# Patient Record
Sex: Male | Born: 1990 | Race: Black or African American | Hispanic: No | Marital: Married | Smoking: Never smoker
Health system: Southern US, Community
[De-identification: ages and names within clinical notes are randomized; demographics above are authoritative.]

## PROBLEM LIST (undated history)

## (undated) DIAGNOSIS — J309 Allergic rhinitis, unspecified: Secondary | ICD-10-CM

## (undated) DIAGNOSIS — F419 Anxiety disorder, unspecified: Secondary | ICD-10-CM

## (undated) DIAGNOSIS — G4700 Insomnia, unspecified: Secondary | ICD-10-CM

## (undated) DIAGNOSIS — F909 Attention-deficit hyperactivity disorder, unspecified type: Secondary | ICD-10-CM

## (undated) HISTORY — DX: Allergic rhinitis, unspecified: J30.9

## (undated) HISTORY — DX: Insomnia, unspecified: G47.00

## (undated) HISTORY — DX: Attention-deficit hyperactivity disorder, unspecified type: F90.9

## (undated) HISTORY — PX: ARTERIAL BYPASS SURGRY: SHX557

## (undated) HISTORY — DX: Anxiety disorder, unspecified: F41.9

---
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
## 2014-03-28 ENCOUNTER — Emergency Department
Admission: EM | Admit: 2014-03-28 | Discharge: 2014-03-28 | Disposition: A | Discharge status: Home / Self Care | Payer: Auto Insurance (includes no fault) | Attending: Emergency Medicine | Admitting: Emergency Medicine

## 2014-03-28 ENCOUNTER — Emergency Department: Payer: Auto Insurance (includes no fault)

## 2014-03-28 ENCOUNTER — Emergency Department: Payer: Self-pay

## 2014-03-28 DIAGNOSIS — S40012A Contusion of left shoulder, initial encounter: Secondary | ICD-10-CM

## 2014-03-28 DIAGNOSIS — S161XXA Strain of muscle, fascia and tendon at neck level, initial encounter: Secondary | ICD-10-CM

## 2014-03-28 DIAGNOSIS — S40019A Contusion of unspecified shoulder, initial encounter: Secondary | ICD-10-CM | POA: Insufficient documentation

## 2014-03-28 DIAGNOSIS — S139XXA Sprain of joints and ligaments of unspecified parts of neck, initial encounter: Secondary | ICD-10-CM | POA: Insufficient documentation

## 2014-03-28 MED ORDER — IBUPROFEN 600 MG PO TABS
600.0000 mg | ORAL_TABLET | Freq: Once | ORAL | Status: AC
Start: 2014-03-28 — End: 2014-03-28
  Administered 2014-03-28: 600 mg via ORAL

## 2014-03-28 MED ORDER — IBUPROFEN 600 MG PO TABS
ORAL_TABLET | ORAL | Status: AC
Start: 2014-03-28 — End: ?
  Filled 2014-03-28: qty 1

## 2014-03-28 NOTE — ED Notes (Signed)
Pt placed in c-collar per verbal order Dr. Adron Bene.
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
## 2014-03-28 NOTE — Discharge Instructions (Signed)
Contusions (Bruises)  A contusion is a bruise. A bruise happenswhen a blow to your body doesn't break the skin but does break blood vessels beneath the skin. Blood leaking from the broken vessels causes redness and swelling. As it heals, your bruise is likely to turn colors like purple, green, and yellow. This is normal. The bruise should fade in 2 or 3 weeks.    Factors That Make You More Likely to Bruise  Almost everyone bruises now and then. Certain people do bruise more easily than others. You're more prone to bruising as you get older. That's because blood vessels become more fragile with age. You're also more likely to bruise if you have a clotting disorder such as hemophilia or take medications that reduce clotting, including aspirin.  When to Go to the Emergency Room (ER)  Bruises almost always heal on their own without special treatment. But for some people, a bad bruise can be serious. Seek medical care if you:   Have a clotting disorder such as hemophilia.   Have cirrhosis or other serious liver disease.   Takeblood-thinning medications such as warfarin (Coumadin).  Tip:  Apply an ice pack or bag of frozen peas to a bruise (keep a thin cloth between the cold source and your skin). This can help reduce redness and swelling.   What to Expect in the ER  A doctor will examine your bruise and ask about any health conditions you have. In some cases, you may have a test to check how well your blood clots. Other treatment will depend on your needs.  Follow-up  Sometimes a bruise gets worse instead of better. It may become larger and more swollen. This can occur when your body walls off a small pool of blood under the skin (hematoma). In that case, your doctor may need to drain excess blood from the area.   2000-2014 [employer] [address], [place] [address]. All rights reserved. This information is not intended as a substitute for professional medical care. Always follow your  healthcare professional's instructions.          Neck Sprain Or Strain  A sudden force that causes turning or bending of the neck (such as in a car accident) can stretch or tear muscles (strain) and ligaments (sprain) and cause neck pain. Sometimes neck pain occurs after a simple awkward movement. In either case, muscle spasm is commonly present and contributes to the pain.    Unless you had a forceful physical injury (for example, a car accident or fall), X-rays are usually not ordered for the initial evaluation of neck pain. If pain continues and dose not respond to medical treatment, X-rays and other tests may be performed at a later time.  Home care  The following guidelines will help you care for your injury at home:   You may feel more soreness and spasm the first few days after the injury. Reduce your activity level until symptoms begin to improve.   When lying down, use a comfortable pillow that supports the head and keeps the spine in a neutral position. The position of the head should not be tilted forward or backward.   Use ice packs (ice in a plastic bag, wrapped in a towel) to treat acute pain. Apply for 20 minutes every 2-4 hours during the first two days. Then, begin local heat (hot shower, hot bath or heating pad) andmassageto reduce muscle spasm. Some patients feel best alternating hot and cold treatments,   or just staying with one method only. Do what feels the best to you and gives the most relief.   You may use acetaminophen or ibuprofen to control pain, unless another pain medicine was prescribed.If you have chronic liver or kidney disease or ever had a stomach ulcer or GI bleeding, talk with your doctor before using these medicines.  Follow-up care  Follow up with your physician or this facility if your symptoms do not show signs of improvement. Physical therapy may be needed.  If you had X-rays today, they didn't show any broken bones, breaks, or fractures. Sometimes fractures don't show  up on the first X-ray. Bruises and sprains can sometimes hurt as much as a fracture. These injuries can take time to heal completely. If your symptoms don't improve or they get worse, talk with your doctor. You may need a repeat X-ray.  When to seek medical care  Get prompt medical attention if any of the following occur:   Pain becomes worse or spreads into your arms   Weakness or numbness in one or both arms   2000-2014 [employer]. [address], [place] [address]. All rights reserved. This information is not intended as a substitute for professional medical care. Always follow your healthcare professional's instructions.
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
## 2016-08-25 ENCOUNTER — Ambulatory Visit (INDEPENDENT_AMBULATORY_CARE_PROVIDER_SITE_OTHER): Payer: 59 | Admitting: Physician Assistant

## 2016-08-25 ENCOUNTER — Ambulatory Visit (INDEPENDENT_AMBULATORY_CARE_PROVIDER_SITE_OTHER): Payer: 59

## 2016-08-25 VITALS — BP 120/64 | HR 74 | Temp 98.2°F | Ht 67.5 in | Wt 162.0 lb

## 2016-08-25 DIAGNOSIS — M542 Cervicalgia: Secondary | ICD-10-CM

## 2016-08-25 MED ORDER — CYCLOBENZAPRINE HCL 10 MG PO TABS: 5.0000 mg | ORAL_TABLET | Freq: Three times a day (TID) | ORAL | 0 refills | Status: DC | PRN

## 2016-08-25 MED ORDER — KETOROLAC TROMETHAMINE 60 MG/2ML IM SOLN
60.0000 mg | Freq: Once | INTRAMUSCULAR | Status: AC
  Administered 2016-08-25: 60 mg via INTRAMUSCULAR

## 2016-08-25 MED ORDER — NAPROXEN 500 MG PO TABS: 500.0000 mg | ORAL_TABLET | Freq: Two times a day (BID) | ORAL | 0 refills | Status: DC

## 2016-08-25 NOTE — Progress Notes (Signed)
08/25/2016 4:28 PM   DOB: 1991/01/01 / MRN: [id number]  SUBJECTIVE:  Chris Vega is a 26 y.o. male presenting for neck pain since October that has recently worsened today.  Complains of left sided "sharp, shooting" neck pain.  He does associates some left shoulder pain.  He denies paresthesia and weakness today.    He has No Known Allergies.   He  has no past medical history on file.    He  reports that he has never smoked. He does not have any smokeless tobacco history on file. He  has no sexual activity history on file. The patient  has no past surgical history on file.  His family history is not on file.  Review of Systems  Constitutional: Negative for fever.  Musculoskeletal: Positive for neck pain. Negative for back pain, falls, joint pain and myalgias.  Skin: Negative for itching and rash.  Neurological: Negative for dizziness, sensory change and focal weakness.    The problem list and medications were reviewed and updated by myself where necessary and exist elsewhere in the encounter.   OBJECTIVE:  BP 120/64 (BP Location: Right Arm, Patient Position: Sitting, Cuff Size: Normal)   Pulse 74   Temp 98.2 F (36.8 C)   Ht 5' 7.5" (1.715 m)   Wt 162 lb (73.5 kg)   SpO2 98%   BMI 25.00 kg/m   Physical Exam  Constitutional: He is oriented to person, place, and time. He appears well-developed and well-nourished. No distress.  Cardiovascular: Normal rate and regular rhythm.   Pulmonary/Chest: Effort normal and breath sounds normal.  Musculoskeletal: He exhibits no edema or deformity.       Cervical back: He exhibits decreased range of motion (2/2 pain), tenderness and pain. He exhibits no bony tenderness, no swelling, no edema and no deformity.       Back:  Neurological: He is alert and oriented to person, place, and time. He has normal reflexes. He displays normal reflexes. No cranial nerve deficit. He exhibits normal muscle tone. Coordination normal.  Skin: Skin is warm and  dry. He is not diaphoretic.  Psychiatric: He has a normal mood and affect.    No results found for this or any previous visit (from the past 72 hour(s)).  Dg Cervical Spine Complete  Result Date: 08/25/2016 CLINICAL DATA:  26 year old male with acute on chronic cervical neck pain. Neck injury 5 months ago. Initial encounter. EXAM: CERVICAL SPINE - COMPLETE 4+ VIEW COMPARISON:  None. FINDINGS: Mild straightening of cervical lordosis. Normal prevertebral soft tissue contour. Cervicothoracic junction alignment is within normal limits. Relatively preserved cervical disc spaces. Bilateral posterior element alignment is within normal limits. Normal AP alignment. Negative lung apices and visible upper chest. Normal C1-C2 alignment and odontoid. IMPRESSION: Normal radiographic appearance of the cervical spine aside from mild nonspecific straightening of cervical lordosis. Electronically Signed   By: Odessa FlemingH  Hall M.D.   On: 08/25/2016 16:21    ASSESSMENT AND PLAN  Chris Vega was seen today for neck pain.  Diagnoses and all orders for this visit:  Neck pain: This seems acute.  Rads normal.  No radiculopathy thus nsaids and flexeril for now.  Advised he call if radiculopthy and will start pred. -     DG Cervical Spine Complete; Future -     ketorolac (TORADOL) injection 60 mg; Inject 2 mLs (60 mg total) into the muscle once.    The patient is advised to call or return to clinic if he does not see an  improvement in symptoms, or to seek the care of the closest emergency department if he worsens with the above plan.   Deliah Boston, MHS, PA-C [facility] 08/25/2016 4:28 PM

## 2016-08-25 NOTE — Patient Instructions (Signed)
     IF you received an x-ray today, you will receive an invoice from [facility]. Please contact [facility] at [phone] with questions or concerns regarding your invoice.   IF you received labwork today, you will receive an invoice from [facility]. Please contact [facility] at [phone] with questions or concerns regarding your invoice.   Our billing staff will not be able to assist you with questions regarding bills from these companies.  You will be contacted with the lab results as soon as they are available. The fastest way to get your results is to activate your My Chart account. Instructions are located on the last page of this paperwork. If you have not heard from us regarding the results in 2 weeks, please contact this office.     

## 2018-10-25 ENCOUNTER — Ambulatory Visit: Payer: 59 | Admitting: Psychology

## 2018-10-25 DIAGNOSIS — F4322 Adjustment disorder with anxiety: Secondary | ICD-10-CM | POA: Diagnosis not present

## 2018-10-27 ENCOUNTER — Ambulatory Visit: Payer: 59 | Admitting: Psychology

## 2018-11-03 ENCOUNTER — Other Ambulatory Visit: Payer: Self-pay

## 2018-11-03 ENCOUNTER — Ambulatory Visit: Payer: 59 | Admitting: Psychology

## 2018-11-03 DIAGNOSIS — F4322 Adjustment disorder with anxiety: Secondary | ICD-10-CM

## 2018-11-07 ENCOUNTER — Other Ambulatory Visit: Payer: Self-pay

## 2018-11-07 ENCOUNTER — Ambulatory Visit: Payer: 59 | Admitting: Psychology

## 2018-11-07 DIAGNOSIS — F4322 Adjustment disorder with anxiety: Secondary | ICD-10-CM

## 2018-11-16 ENCOUNTER — Ambulatory Visit: Payer: 59 | Admitting: Psychology

## 2018-11-21 ENCOUNTER — Ambulatory Visit (INDEPENDENT_AMBULATORY_CARE_PROVIDER_SITE_OTHER): Payer: 59 | Admitting: Psychology

## 2018-11-21 DIAGNOSIS — F4322 Adjustment disorder with anxiety: Secondary | ICD-10-CM | POA: Diagnosis not present

## 2018-11-29 ENCOUNTER — Ambulatory Visit (INDEPENDENT_AMBULATORY_CARE_PROVIDER_SITE_OTHER): Payer: 59 | Admitting: Psychology

## 2018-11-29 DIAGNOSIS — F4322 Adjustment disorder with anxiety: Secondary | ICD-10-CM

## 2018-12-07 ENCOUNTER — Ambulatory Visit (INDEPENDENT_AMBULATORY_CARE_PROVIDER_SITE_OTHER): Payer: 59 | Admitting: Psychology

## 2018-12-07 DIAGNOSIS — F4322 Adjustment disorder with anxiety: Secondary | ICD-10-CM | POA: Diagnosis not present

## 2018-12-14 ENCOUNTER — Ambulatory Visit (INDEPENDENT_AMBULATORY_CARE_PROVIDER_SITE_OTHER): Payer: 59 | Admitting: Psychology

## 2018-12-14 DIAGNOSIS — F4322 Adjustment disorder with anxiety: Secondary | ICD-10-CM | POA: Diagnosis not present

## 2018-12-21 ENCOUNTER — Ambulatory Visit (INDEPENDENT_AMBULATORY_CARE_PROVIDER_SITE_OTHER): Payer: 59 | Admitting: Psychology

## 2018-12-21 DIAGNOSIS — F4322 Adjustment disorder with anxiety: Secondary | ICD-10-CM | POA: Diagnosis not present

## 2018-12-28 ENCOUNTER — Ambulatory Visit (INDEPENDENT_AMBULATORY_CARE_PROVIDER_SITE_OTHER): Payer: 59 | Admitting: Psychology

## 2018-12-28 DIAGNOSIS — F4322 Adjustment disorder with anxiety: Secondary | ICD-10-CM

## 2019-01-03 ENCOUNTER — Ambulatory Visit (INDEPENDENT_AMBULATORY_CARE_PROVIDER_SITE_OTHER): Payer: 59 | Admitting: Psychology

## 2019-01-03 DIAGNOSIS — F4322 Adjustment disorder with anxiety: Secondary | ICD-10-CM | POA: Diagnosis not present

## 2019-01-10 ENCOUNTER — Ambulatory Visit (INDEPENDENT_AMBULATORY_CARE_PROVIDER_SITE_OTHER): Payer: 59 | Admitting: Psychology

## 2019-01-10 DIAGNOSIS — F4322 Adjustment disorder with anxiety: Secondary | ICD-10-CM

## 2019-01-17 ENCOUNTER — Ambulatory Visit (INDEPENDENT_AMBULATORY_CARE_PROVIDER_SITE_OTHER): Payer: 59 | Admitting: Psychology

## 2019-01-17 DIAGNOSIS — F4322 Adjustment disorder with anxiety: Secondary | ICD-10-CM | POA: Diagnosis not present

## 2019-01-24 ENCOUNTER — Ambulatory Visit (INDEPENDENT_AMBULATORY_CARE_PROVIDER_SITE_OTHER): Payer: 59 | Admitting: Psychology

## 2019-01-24 DIAGNOSIS — F4322 Adjustment disorder with anxiety: Secondary | ICD-10-CM

## 2019-01-31 ENCOUNTER — Ambulatory Visit (INDEPENDENT_AMBULATORY_CARE_PROVIDER_SITE_OTHER): Payer: 59 | Admitting: Psychology

## 2019-01-31 DIAGNOSIS — F4322 Adjustment disorder with anxiety: Secondary | ICD-10-CM

## 2019-02-07 ENCOUNTER — Ambulatory Visit (INDEPENDENT_AMBULATORY_CARE_PROVIDER_SITE_OTHER): Payer: 59 | Admitting: Psychology

## 2019-02-07 DIAGNOSIS — F4322 Adjustment disorder with anxiety: Secondary | ICD-10-CM | POA: Diagnosis not present

## 2019-02-14 ENCOUNTER — Ambulatory Visit (INDEPENDENT_AMBULATORY_CARE_PROVIDER_SITE_OTHER): Payer: 59 | Admitting: Psychology

## 2019-02-14 DIAGNOSIS — F4322 Adjustment disorder with anxiety: Secondary | ICD-10-CM | POA: Diagnosis not present

## 2019-02-21 ENCOUNTER — Ambulatory Visit (INDEPENDENT_AMBULATORY_CARE_PROVIDER_SITE_OTHER): Payer: 59 | Admitting: Psychology

## 2019-02-21 DIAGNOSIS — F4322 Adjustment disorder with anxiety: Secondary | ICD-10-CM | POA: Diagnosis not present

## 2019-02-28 ENCOUNTER — Ambulatory Visit (INDEPENDENT_AMBULATORY_CARE_PROVIDER_SITE_OTHER): Payer: 59 | Admitting: Psychology

## 2019-02-28 DIAGNOSIS — F4322 Adjustment disorder with anxiety: Secondary | ICD-10-CM | POA: Diagnosis not present

## 2019-03-07 ENCOUNTER — Ambulatory Visit (INDEPENDENT_AMBULATORY_CARE_PROVIDER_SITE_OTHER): Payer: 59 | Admitting: Psychology

## 2019-03-07 DIAGNOSIS — F4322 Adjustment disorder with anxiety: Secondary | ICD-10-CM | POA: Diagnosis not present

## 2019-03-14 ENCOUNTER — Ambulatory Visit (INDEPENDENT_AMBULATORY_CARE_PROVIDER_SITE_OTHER): Payer: 59 | Admitting: Psychology

## 2019-03-14 DIAGNOSIS — F4322 Adjustment disorder with anxiety: Secondary | ICD-10-CM

## 2019-03-28 ENCOUNTER — Ambulatory Visit (INDEPENDENT_AMBULATORY_CARE_PROVIDER_SITE_OTHER): Payer: 59 | Admitting: Psychology

## 2019-03-28 DIAGNOSIS — F4322 Adjustment disorder with anxiety: Secondary | ICD-10-CM | POA: Diagnosis not present

## 2019-04-06 ENCOUNTER — Ambulatory Visit (INDEPENDENT_AMBULATORY_CARE_PROVIDER_SITE_OTHER): Payer: 59 | Admitting: Psychology

## 2019-04-06 DIAGNOSIS — F4322 Adjustment disorder with anxiety: Secondary | ICD-10-CM | POA: Diagnosis not present

## 2019-04-11 ENCOUNTER — Ambulatory Visit: Payer: 59 | Admitting: Psychology

## 2019-04-18 ENCOUNTER — Ambulatory Visit (INDEPENDENT_AMBULATORY_CARE_PROVIDER_SITE_OTHER): Payer: 59 | Admitting: Psychology

## 2019-04-18 DIAGNOSIS — F4322 Adjustment disorder with anxiety: Secondary | ICD-10-CM | POA: Diagnosis not present

## 2019-04-25 ENCOUNTER — Ambulatory Visit (INDEPENDENT_AMBULATORY_CARE_PROVIDER_SITE_OTHER): Payer: 59 | Admitting: Psychology

## 2019-04-25 DIAGNOSIS — F4322 Adjustment disorder with anxiety: Secondary | ICD-10-CM | POA: Diagnosis not present

## 2019-05-02 ENCOUNTER — Ambulatory Visit: Payer: 59 | Admitting: Psychology

## 2019-05-09 ENCOUNTER — Ambulatory Visit (INDEPENDENT_AMBULATORY_CARE_PROVIDER_SITE_OTHER): Payer: 59 | Admitting: Psychology

## 2019-05-09 DIAGNOSIS — F4322 Adjustment disorder with anxiety: Secondary | ICD-10-CM | POA: Diagnosis not present

## 2019-05-16 ENCOUNTER — Ambulatory Visit: Payer: 59 | Admitting: Psychology

## 2019-05-23 ENCOUNTER — Ambulatory Visit (INDEPENDENT_AMBULATORY_CARE_PROVIDER_SITE_OTHER): Payer: 59 | Admitting: Psychology

## 2019-05-23 DIAGNOSIS — F4322 Adjustment disorder with anxiety: Secondary | ICD-10-CM

## 2019-05-30 ENCOUNTER — Ambulatory Visit: Payer: 59 | Admitting: Psychology

## 2019-05-31 ENCOUNTER — Ambulatory Visit (INDEPENDENT_AMBULATORY_CARE_PROVIDER_SITE_OTHER): Payer: 59 | Admitting: Psychology

## 2019-05-31 DIAGNOSIS — F4322 Adjustment disorder with anxiety: Secondary | ICD-10-CM | POA: Diagnosis not present

## 2019-06-06 ENCOUNTER — Ambulatory Visit (INDEPENDENT_AMBULATORY_CARE_PROVIDER_SITE_OTHER): Payer: 59 | Admitting: Psychology

## 2019-06-06 DIAGNOSIS — F4322 Adjustment disorder with anxiety: Secondary | ICD-10-CM

## 2019-06-13 ENCOUNTER — Ambulatory Visit: Payer: 59 | Admitting: Psychology

## 2019-06-20 ENCOUNTER — Ambulatory Visit (INDEPENDENT_AMBULATORY_CARE_PROVIDER_SITE_OTHER): Payer: 59 | Admitting: Psychology

## 2019-06-20 DIAGNOSIS — F4322 Adjustment disorder with anxiety: Secondary | ICD-10-CM

## 2019-06-27 ENCOUNTER — Ambulatory Visit (INDEPENDENT_AMBULATORY_CARE_PROVIDER_SITE_OTHER): Payer: 59 | Admitting: Psychology

## 2019-06-27 DIAGNOSIS — F4322 Adjustment disorder with anxiety: Secondary | ICD-10-CM

## 2019-07-04 ENCOUNTER — Ambulatory Visit: Payer: 59 | Admitting: Psychology

## 2019-07-11 ENCOUNTER — Ambulatory Visit (INDEPENDENT_AMBULATORY_CARE_PROVIDER_SITE_OTHER): Payer: 59 | Admitting: Psychology

## 2019-07-11 DIAGNOSIS — F4322 Adjustment disorder with anxiety: Secondary | ICD-10-CM

## 2019-07-17 ENCOUNTER — Ambulatory Visit: Payer: 59 | Admitting: Psychology

## 2019-07-18 ENCOUNTER — Ambulatory Visit: Payer: 59 | Admitting: Psychology

## 2019-07-25 ENCOUNTER — Ambulatory Visit (INDEPENDENT_AMBULATORY_CARE_PROVIDER_SITE_OTHER): Payer: 59 | Admitting: Psychology

## 2019-07-25 DIAGNOSIS — F4322 Adjustment disorder with anxiety: Secondary | ICD-10-CM | POA: Diagnosis not present

## 2019-08-01 ENCOUNTER — Ambulatory Visit: Payer: 59 | Admitting: Psychology

## 2019-08-08 ENCOUNTER — Ambulatory Visit (INDEPENDENT_AMBULATORY_CARE_PROVIDER_SITE_OTHER): Payer: Managed Care, Other (non HMO) | Admitting: Psychology

## 2019-08-08 DIAGNOSIS — F4322 Adjustment disorder with anxiety: Secondary | ICD-10-CM | POA: Diagnosis not present

## 2019-08-15 ENCOUNTER — Ambulatory Visit: Payer: 59 | Admitting: Psychology

## 2019-08-22 ENCOUNTER — Ambulatory Visit (INDEPENDENT_AMBULATORY_CARE_PROVIDER_SITE_OTHER): Payer: Managed Care, Other (non HMO) | Admitting: Psychology

## 2019-08-22 DIAGNOSIS — F4322 Adjustment disorder with anxiety: Secondary | ICD-10-CM | POA: Diagnosis not present

## 2019-09-05 ENCOUNTER — Encounter: Payer: Self-pay | Admitting: Psychiatry

## 2019-09-05 ENCOUNTER — Ambulatory Visit (INDEPENDENT_AMBULATORY_CARE_PROVIDER_SITE_OTHER): Payer: 59 | Admitting: Psychiatry

## 2019-09-05 ENCOUNTER — Ambulatory Visit (INDEPENDENT_AMBULATORY_CARE_PROVIDER_SITE_OTHER): Payer: Self-pay | Admitting: Psychology

## 2019-09-05 VITALS — HR 59 | Ht 67.0 in | Wt 165.0 lb

## 2019-09-05 DIAGNOSIS — F4322 Adjustment disorder with anxiety: Secondary | ICD-10-CM

## 2019-09-05 DIAGNOSIS — F902 Attention-deficit hyperactivity disorder, combined type: Secondary | ICD-10-CM

## 2019-09-05 DIAGNOSIS — F419 Anxiety disorder, unspecified: Secondary | ICD-10-CM

## 2019-09-05 MED ORDER — AMPHETAMINE-DEXTROAMPHET ER 15 MG PO CP24: 15.0000 mg | ORAL_CAPSULE | ORAL | 0 refills | Status: DC

## 2019-09-05 NOTE — Progress Notes (Signed)
Virtual Visit via Video Note  I connected with Chris Vega on 09/05/19 at 10:00 AM EST by a video enabled telemedicine application and verified that I am speaking with the correct person using two identifiers.  Location: Patient: Home Provider: Home   I discussed the limitations of evaluation and management by telemedicine and the availability of in person appointments. The patient expressed understanding and agreed to proceed.  I discussed the assessment and treatment plan with the patient. The patient was provided an opportunity to ask questions and all were answered. The patient agreed with the plan and demonstrated an understanding of the instructions.   The patient was advised to call back or seek an in-person evaluation if the symptoms worsen or if the condition fails to improve as anticipated.  I provided 75 minutes of non-face-to-face time during this encounter.   Corie Chiquito, PMHNP    [facility] MD/PA/NP Initial Note  09/06/2019 4:03 PM Chris Vega  MRN:  [id number]  Chief Complaint:  Chief Complaint    ADD; Anxiety      HPI: Pt is a 29 yo male being seen for initial evaluation for ADD and h/o anxiety. He reports that he would like to return to school [employer] for [employer], possibly in early February. Went to school at [employer] and was dx'd with ADD in his second semester. He was treated with Adderall XR and had difficulty falling asleep. He was then swtiched to Adderall IR. He recently tried Adderall XR and did not have difficulty falling asleep but had early morning awakening.   He reports that he was not a good student in high school and did not do his home work. He reports that he tested well. 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 Reports that he has been told when he started school his parents told him that he reportedly went to every  classroom to introduce himself.  Recalls falling asleep in class and having difficulty staying focused. Reports that he was easily distracted.  More difficulty focusing on things that are not as interested in and can focus better on things he is interested in. He reports procrastination and delays certain tasks. He reports difficulty sitting still. He reports that leg bounces constantly and will pace.   Reports that when he was taking Adderall XR 15 mg it would last from 6 am until 5-6 pm and was able to complete tasks more easily. Was waking 1-2 hours earlier than usual over the first week. Took it for about 3 weeks. He reports that his mood was more level on Adderall XR 15 mg po qd.   He reports that he will frequently lose and misplace items, such as his keys. He reports that he used to have difficulty keeping up with appointments and now using calendar and other strategies to be on time. He reports that he feels compelled to fill in gaps in conversations. He also will interrupt his wife so that he does not forget things.   He reports that he has h/o anxiety with occasional panic attacks. 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 Reports that work has triggered anxiety. Reports some anxiety in social situations. Will tend to avoid parties. Prefers small groups and people that he already knows. Denies feeling as if he is  being scrutinized.   He reports some occasional anxious thoughts. Some worry during periods of stress and change. Reports that he has occ worry about finances. He reports some muscle tension with anxiety and had pain for 1.5 years in his neck that spontaneously resolved. Will feel tightness in his chest with anxiety. Has not had any panic attacks in the last 6 months. Denies any checking behaviors, obsessions, or compulsions. He reports that he has some patterns and routines, such as his morning  routine. Denies intrusive thoughts.   He describes his mood is "neutral to positive." He reports that his energy is ok. Energy and motivation have been lower since stopping Adderall. Has had some difficulty with sleep over the last several days, which may be related to wife possibly going to [place]. Reports that he had some middle of the night awakening when he was taking Adderall XR. Tries to sleep 7 hours a night. Averages 6-6.5hours a night. Appetite has been ok. He reports that he will intermittently fast. He reports periods of mild to moderate depression in the past. Had lower energy and motivation during those times. Denies current or past SI.   Denies periods of decreased need for sleep. Reports occ periods of increased energy and goal-directed activity from a day to weeks when he has a "new found interest." Denies h/o elevated mood. He reports impulsivity since childhood to include shoplifting. Denies excessive spending.   Denies AH, VH, or paranoia.   Born in [place]. Moved [place] the [place] age 80 and moved to [place], [place]. Has a sister that is 5 years younger. He reports that overall childhood was positive and describes himself and sister as "latch key kids." Moved to [place] to [facility]. Attended [employer] and then dropped out. 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 He reports that he has had difficulty maintaining employment due to anxiety and ADD. Describes himself as an introvert. Has a close relationship with his wife. Married almost 6 years. Wife has PhD and is pursuing professorship. Wife is going to [place] for work.  Family has been in [employer] and has worked intermittently as Production assistant, radio. He reports that he enjoys listening to podcasts and tends to listen to podcasts while doing other tasks. Enjoys gaming and computers.   Past Psychiatric Medication Trials: Adderall XR- Caused insomnia in the past. Was prescribed 15 mg in the past. Recently took some that was remaining.   Adderall- Effective for about 2 years in college. Has not taken in about 3 years. May have taken 20 mg BID. Noticed some irritability towards the end of the day.  Xanax- Has used prn for panic. Has not taken in over a year Klonopin- Was not as effective for panic Lexapro- Took for a couple of weeks and then stopped taking it. Recalls feeling emotionally dulled.  Visit Diagnosis:    ICD-10-CM   1. Attention deficit hyperactivity disorder (ADHD), combined type  F90.2 amphetamine-dextroamphetamine (ADDERALL XR) 15 MG 24 hr capsule  2. Anxiety disorder, unspecified type  F41.9     Past Psychiatric History: Has been seeing Maggie Font, [facility] for about a year. Saw psychiatry at [facility] for ADD.   Past Medical History:  Past Medical History:  Diagnosis Date  . Allergic rhinitis    History reviewed. No pertinent surgical history.  Family Psychiatric History: Suspects that there may be family members that have untreated psychiatric s/s to include father having ADD s/s and mother having episodes of depression. Sister seems  to have some mood lability.   Family History:  Family History  Problem Relation Age of Onset  . Lung cancer Maternal Grandfather   . Breast cancer Paternal Grandmother     Social History:  Social History   Socioeconomic History  . Marital status: Married    Spouse name: Not on file  . Number of children: Not on file  . Years of education: Not on file  . Highest education level: Not on file  Occupational History  . Not on file  Tobacco Use  . Smoking status: Never Smoker  . Smokeless tobacco: Former Network engineer and Sexual Activity  . Alcohol use: Yes    Comment: Reports infrequent ETOH use and will drink 3-4 drinks  . Drug use: Yes    Types: Marijuana  . Sexual activity: Not on file  Other Topics Concern  . Not on file  Social History Narrative  . Not on file   Social Determinants of Health   Financial Resource Strain:   . Difficulty of Paying  Living Expenses: Not on file  Food Insecurity:   . Worried About Charity fundraiser in the Last Year: Not on file  . Ran Out of Food in the Last Year: Not on file  Transportation Needs:   . Lack of Transportation (Medical): Not on file  . Lack of Transportation (Non-Medical): Not on file  Physical Activity:   . Days of Exercise per Week: Not on file  . Minutes of Exercise per Session: Not on file  Stress:   . Feeling of Stress : Not on file  Social Connections:   . Frequency of Communication with Friends and Family: Not on file  . Frequency of Social Gatherings with Friends and Family: Not on file  . Attends Religious Services: Not on file  . Active Member of Clubs or Organizations: Not on file  . Attends Archivist Meetings: Not on file  . Marital Status: Not on file    Allergies: No Known Allergies  Metabolic Disorder Labs: No results found for: HGBA1C, MPG No results found for: PROLACTIN No results found for: CHOL, TRIG, HDL, CHOLHDL, VLDL, LDLCALC No results found for: TSH  Therapeutic Level Labs: No results found for: LITHIUM No results found for: VALPROATE No components found for:  CBMZ  Current Medications: Current Outpatient Medications  Medication Sig Dispense Refill  . calcium carbonate (TUMS - DOSED IN MG ELEMENTAL CALCIUM) 500 MG chewable tablet Chew 1 tablet by mouth daily.    Marland Kitchen ALPRAZolam (XANAX PO) Take by mouth.    Marland Kitchen amphetamine-dextroamphetamine (ADDERALL XR) 15 MG 24 hr capsule Take 1 capsule by mouth every morning. 30 capsule 0  . cyclobenzaprine (FLEXERIL) 10 MG tablet Take 0.5-1 tablets (5-10 mg total) by mouth 3 (three) times daily as needed for muscle spasms (May cause drowsiness. Do no operate heavy machinery while taking.). (Patient not taking: Reported on 09/05/2019) 30 tablet 0  . naproxen (NAPROSYN) 500 MG tablet Take 1 tablet (500 mg total) by mouth 2 (two) times daily with a meal. (Patient not taking: Reported on 09/05/2019) 30 tablet 0    No current facility-administered medications for this visit.    Medication Side Effects: N/A  Orders placed this visit:  No orders of the defined types were placed in this encounter.   Psychiatric Specialty Exam:  Review of Systems  Constitutional: Negative.   HENT: Positive for congestion.   Eyes: Negative.   Respiratory: Negative.   Cardiovascular: Negative.  Negative for palpitations.  Gastrointestinal: Negative.   Endocrine: Negative.   Genitourinary: Negative.   Musculoskeletal: Positive for back pain.  Skin: Negative.   Allergic/Immunologic: Positive for environmental allergies.  Neurological: Negative.   Hematological: Negative.   Psychiatric/Behavioral:       Please refer to HPI    Pulse (!) 59, height 5\' 7"  (1.702 m), weight 165 lb (74.8 kg).Body mass index is 25.84 kg/m.  General Appearance: Casual and Neat  Eye Contact:  Good  Speech:  Clear and Coherent and Normal Rate  Volume:  Normal  Mood:  Anxious  Affect:  Appropriate, Congruent and Full Range  Thought Process:  Coherent and Descriptions of Associations: Intact  Orientation:  Full (Time, Place, and Person)  Thought Content: Logical and Hallucinations: None   Suicidal Thoughts:  No  Homicidal Thoughts:  No  Memory:  WNL  Judgement:  Good  Insight:  Good  Psychomotor Activity:  Increased and Restlessness  Concentration:  Concentration: Fair and Attention Span: Fair  Recall:  Good  Fund of Knowledge: Good  Language: Good  Assets:  Communication Skills Desire for Improvement Resilience Social Support  ADL's:  Intact  Cognition: WNL  Prognosis:  Good   Screenings:  PHQ2-9     Office Visit from 08/25/2016 in Primary Care at [facility]  PHQ-2 Total Score  0      Receiving Psychotherapy: Yes   Treatment Plan/Recommendations: Pt seen for 60 minutes and time spent counseling pt re: anxiety and ADD s/s, and discussed how these s/s can exacerbate the other. Discussed past response to medications and  potential benefits, risks, and side effects of Adderall XR. Discussed potential benefits, risks, and side effects of stimulants with patient to include increased heart rate, palpitations, insomnia, increased anxiety, increased irritability, or decreased appetite.  Instructed patient to contact office if experiencing any significant tolerability issues. Will start Adderall XR 15 mg po q am for ADD. Will monitor for s/s of increased anxiety. Recommend continuing psychotherapy with [facility]. Pt to f/u in 4 weeks or sooner if clinically indicated. Patient advised to contact office with any questions, adverse effects, or acute worsening in signs and symptoms.   Maggie Font, PMHNP

## 2019-09-12 ENCOUNTER — Ambulatory Visit (INDEPENDENT_AMBULATORY_CARE_PROVIDER_SITE_OTHER): Payer: 59

## 2019-09-12 ENCOUNTER — Ambulatory Visit: Payer: 59 | Admitting: Family Medicine

## 2019-09-12 ENCOUNTER — Encounter: Payer: Self-pay | Admitting: Family Medicine

## 2019-09-12 ENCOUNTER — Other Ambulatory Visit: Payer: Self-pay

## 2019-09-12 VITALS — BP 126/80 | HR 61 | Temp 96.1°F | Resp 12 | Ht 67.0 in | Wt 169.0 lb

## 2019-09-12 DIAGNOSIS — F419 Anxiety disorder, unspecified: Secondary | ICD-10-CM | POA: Insufficient documentation

## 2019-09-12 DIAGNOSIS — R222 Localized swelling, mass and lump, trunk: Secondary | ICD-10-CM | POA: Diagnosis not present

## 2019-09-12 DIAGNOSIS — M545 Low back pain, unspecified: Secondary | ICD-10-CM

## 2019-09-12 DIAGNOSIS — F902 Attention-deficit hyperactivity disorder, combined type: Secondary | ICD-10-CM | POA: Diagnosis not present

## 2019-09-12 DIAGNOSIS — Z23 Encounter for immunization: Secondary | ICD-10-CM

## 2019-09-12 MED ORDER — TIZANIDINE HCL 4 MG PO TABS: 4.0000 mg | ORAL_TABLET | Freq: Every evening | ORAL | 0 refills | Status: DC | PRN

## 2019-09-12 NOTE — Addendum Note (Signed)
Addended by: Philemon Kingdom on: 09/12/2019 09:49 AM   Modules accepted: Orders

## 2019-09-12 NOTE — Progress Notes (Signed)
HPI:   [name] is a 29 y.o. male, who is here today to establish care.  Former PCP: N/A Last preventive routine visit: Around 2012.  Chronic medical problems: ADHD and anxiety, he is on Adderall XR 15 mg daily. He follows with psychiatrist. Asthma during childhood. No hx of tobacco use. He does not drink alcohol frequent but he he does he drinks a moderate amount for several days.He usually does not get drunk. He lives with his wife.  Concerns today: 2 months of mid lower back pain.  Pain started a days after exercising but negative for recent injury or unusual level of activity.  Pain is achy,no radiated, intermittent 5/10 in intensity, with no associated LE numbness, tingling, urinary incontinence or retention, stool incontinence, or saddle anesthesia.  Exacerbated by bending , mainly in the morning when he gets up. Alleviated by movement. + Morning stiffness. No arthralgia,joint edema,or erythema. FHx negative for rheumatologic disorder. No rash or edema on affected area, fever, chills, or abnormal wt loss.  No limitations of ADL's.  Prior Hx of back pain: Negative. He has had cervical pain the lasted 2 years ,attributed to stress and resolved.   OTC medications: N/A  Review of Systems  Constitutional: Negative for activity change, appetite change and fatigue.  HENT: Negative for mouth sores, nosebleeds and sore throat.   Eyes: Negative for redness and visual disturbance.  Respiratory: Negative for cough, shortness of breath and wheezing.   Cardiovascular: Negative for chest pain, palpitations and leg swelling.  Gastrointestinal: Negative for abdominal pain, nausea and vomiting.       No changes in bowel habits.  Genitourinary: Negative for decreased urine volume, dysuria and hematuria.  Musculoskeletal: Negative for joint swelling and neck stiffness.  Neurological: Negative for weakness and numbness.  Hematological: Negative for adenopathy. Does not  bruise/bleed easily.  Rest see pertinent positives and negatives per HPI.   Current Outpatient Medications on File Prior to Visit  Medication Sig Dispense Refill  . ALPRAZolam (XANAX PO) Take by mouth.    Marland Kitchen amphetamine-dextroamphetamine (ADDERALL XR) 15 MG 24 hr capsule Take 1 capsule by mouth every morning. 30 capsule 0  . calcium carbonate (TUMS - DOSED IN MG ELEMENTAL CALCIUM) 500 MG chewable tablet Chew 1 tablet by mouth daily.     No current facility-administered medications on file prior to visit.     Past Medical History:  Diagnosis Date  . Allergic rhinitis    No Known Allergies  Family History  Problem Relation Age of Onset  . Lung cancer Maternal Grandfather   . Breast cancer Paternal Grandmother     Social History   Socioeconomic History  . Marital status: Married    Spouse name: Not on file  . Number of children: Not on file  . Years of education: Not on file  . Highest education level: Not on file  Occupational History  . Not on file  Tobacco Use  . Smoking status: Never Smoker  . Smokeless tobacco: Former Network engineer and Sexual Activity  . Alcohol use: Yes    Comment: Reports infrequent ETOH use and will drink 3-4 drinks  . Drug use: Yes    Types: Marijuana  . Sexual activity: Not on file  Other Topics Concern  . Not on file  Social History Narrative  . Not on file   Social Determinants of Health   Financial Resource Strain:   . Difficulty of Paying Living Expenses: Not on file  Food Insecurity:   . Worried About Programme researcher, broadcasting/film/video in the Last Year: Not on file  . Ran Out of Food in the Last Year: Not on file  Transportation Needs:   . Lack of Transportation (Medical): Not on file  . Lack of Transportation (Non-Medical): Not on file  Physical Activity:   . Days of Exercise per Week: Not on file  . Minutes of Exercise per Session: Not on file  Stress:   . Feeling of Stress : Not on file  Social Connections:   . Frequency of  Communication with Friends and Family: Not on file  . Frequency of Social Gatherings with Friends and Family: Not on file  . Attends Religious Services: Not on file  . Active Member of Clubs or Organizations: Not on file  . Attends Banker Meetings: Not on file  . Marital Status: Not on file    Vitals:   09/12/19 0835  BP: 126/80  Pulse: 61  Resp: 12  Temp: (!) 96.1 F (35.6 C)  SpO2: 97%    Body mass index is 26.47 kg/m.  Physical Exam  Nursing note and vitals reviewed. Constitutional: He is oriented to person, place, and time. He appears well-developed and well-nourished. No distress.  HENT:  Head: Normocephalic and atraumatic.  Mask on.  Eyes: Pupils are equal, round, and reactive to light. Conjunctivae are normal.  Cardiovascular: Normal rate and regular rhythm.  No murmur heard. Pulses:      Dorsalis pedis pulses are 2+ on the right side and 2+ on the left side.  Respiratory: Effort normal and breath sounds normal. No respiratory distress.  GI: Soft. He exhibits no mass. There is no hepatomegaly. There is no abdominal tenderness.  Musculoskeletal:        General: No edema.     Thoracic back: No tenderness or bony tenderness.     Lumbar back: No tenderness or bony tenderness.  Lymphadenopathy:    He has no cervical adenopathy.  Neurological: He is alert and oriented to person, place, and time. He has normal strength. No cranial nerve deficit. Gait normal.  Reflex Scores:      Patellar reflexes are 2+ on the right side and 2+ on the left side. SLR negative bilateral.  Skin: Skin is warm. No rash noted. No erythema.     Upon palpation nodular lesions noted around sacrum area , bilateral. Right is about 2.5-3 cm.Left one smaller. Both with defined borders,no tender,vey mobile, and with no associated skin changes.  Psychiatric: He has a normal mood and affect.  Well groomed, good eye contact.   ASSESSMENT AND PLAN:  Mr. Chris Vega was seen today for  establish care.  Diagnoses and all orders for this visit:  Midline low back pain without sciatica, unspecified chronicity History and findings on examination do not suggest a serious process. Because he is reporting morning stiffness, I recommend lumbar x-ray. Some side effects of muscle relaxants discussed, recommend taking Zanaflex at bedtime as needed. Tylenol 500 mg 3-4 times per day if needed for pain. PT will be arranged. Further recommendation will be given according to imaging results.  -     DG Lumbar Spine Complete; Future -     tiZANidine (ZANAFLEX) 4 MG tablet; Take 1 tablet (4 mg total) by mouth at bedtime as needed for muscle spasms. -     Ambulatory referral to Physical Therapy  Palpable mass of lower back Incidental finding. Most likely benign,?  Lipoma. I do not  think this is causing back pain. Instructed to continue monitoring for changes, eventually lesions may need to be removed due to growth.  Need for influenza vaccination -     Flu Vaccine QUAD 36+ mos IM  ADHD (attention deficit hyperactivity disorder), combined type Following with psychiatrist.  Anxiety disorder, unspecified type Continue following with psychiatrist.   Return in about 1 year (around 09/11/2020), or if symptoms worsen or fail to improve.   Marcee Jacobs G. [place], MD  [facility]. [place] office.

## 2019-09-12 NOTE — Patient Instructions (Signed)
A few things to remember from today's visit:   Midline low back pain without sciatica, unspecified chronicity - Plan: DG Lumbar Spine Complete, tiZANidine (ZANAFLEX) 4 MG tablet, Ambulatory referral to Physical Therapy  Palpable mass of lower back  Back pain most likely muscular, it seems a benign process. Posture is important and avoid prolonged standing or sitting.   Back Pain When back pain lasts longer than 3 months, it is called chronic back pain.The cause of your back pain may not be known. Some common causes include:  Wear and tear (degenerative disease) of the bones, ligaments, or disks in your back.  Inflammation and stiffness in your back (arthritis). People who have chronic back pain often go through certain periods in which the pain is more intense (flare-ups). Many people can learn to manage the pain with home care. Follow these instructions at home: Pay attention to any changes in your symptoms. Take these actions to help with your pain: Activity   Avoid bending and other activities that make the problem worse.  Maintain a proper position when standing or sitting: ? When standing, keep your upper back and neck straight, with your shoulders pulled back. Avoid slouching. ? When sitting, keep your back straight and relax your shoulders. Do not round your shoulders or pull them backward.  Do not sit or stand in one place for long periods of time.  Take brief periods of rest throughout the day. This will reduce your pain. Resting in a lying or standing position is usually better than sitting to rest.  When you are resting for longer periods, mix in some mild activity or stretching between periods of rest. This will help to prevent stiffness and pain.  Get regular exercise. Ask your health care provider what activities are safe for you.  Do not lift anything that is heavier than 10 lb (4.5 kg). Always use proper lifting technique, which includes: ? Bending your  knees. ? Keeping the load close to your body. ? Avoiding twisting.  Sleep on a firm mattress in a comfortable position. Try lying on your side with your knees slightly bent. If you lie on your back, put a pillow under your knees. Managing pain  If directed, apply ice to the painful area. Your health care provider may recommend applying ice during the first 24-48 hours after a flare-up begins. ? Put ice in a plastic bag. ? Place a towel between your skin and the bag. ? Leave the ice on for 20 minutes, 2-3 times per day.  If directed, apply heat to the affected area as often as told by your health care provider. Use the heat source that your health care provider recommends, such as a moist heat pack or a heating pad. ? Place a towel between your skin and the heat source. ? Leave the heat on for 20-30 minutes. ? Remove the heat if your skin turns bright red. This is especially important if you are unable to feel pain, heat, or cold. You may have a greater risk of getting burned.  Try soaking in a warm tub.  Take over-the-counter and prescription medicines only as told by your health care provider.  Keep all follow-up visits as told by your health care provider. This is important. Contact a health care provider if:  You have pain that is not relieved with rest or medicine. Get help right away if:  You have weakness or numbness in one or both of your legs or feet.  You have  trouble controlling your bladder or your bowels.  You have nausea or vomiting.  You have pain in your abdomen.  You have shortness of breath or you faint. This information is not intended to replace advice given to you by your health care provider. Make sure you discuss any questions you have with your health care provider. Document Revised: 12/01/2018 Document Reviewed: 02/17/2017 Elsevier Patient Education  2020 [employer].  ? Lipoma, continue monitoring for changes.  Please be sure medication list is  accurate. If a new problem present, please set up appointment sooner than planned today.

## 2019-09-13 ENCOUNTER — Encounter: Payer: Self-pay | Admitting: Family Medicine

## 2019-09-19 ENCOUNTER — Ambulatory Visit (INDEPENDENT_AMBULATORY_CARE_PROVIDER_SITE_OTHER): Payer: 59 | Admitting: Psychology

## 2019-09-19 DIAGNOSIS — F4322 Adjustment disorder with anxiety: Secondary | ICD-10-CM

## 2019-09-26 ENCOUNTER — Ambulatory Visit: Payer: 59 | Admitting: Physical Therapy

## 2019-10-02 ENCOUNTER — Other Ambulatory Visit: Payer: Self-pay | Admitting: Psychiatry

## 2019-10-02 DIAGNOSIS — F902 Attention-deficit hyperactivity disorder, combined type: Secondary | ICD-10-CM

## 2019-10-02 NOTE — Telephone Encounter (Signed)
Chris Vega had appt Tuesday 2/9 but we had to reschedue.  Now appt is 2/23. He needs refill of his adderall, but wants to discuss dosage change.  Please call before you send the refill. Send to [facility]

## 2019-10-03 ENCOUNTER — Ambulatory Visit (INDEPENDENT_AMBULATORY_CARE_PROVIDER_SITE_OTHER): Payer: Self-pay | Admitting: Psychology

## 2019-10-03 ENCOUNTER — Ambulatory Visit: Payer: Self-pay | Admitting: Psychiatry

## 2019-10-03 DIAGNOSIS — F4322 Adjustment disorder with anxiety: Secondary | ICD-10-CM

## 2019-10-04 ENCOUNTER — Other Ambulatory Visit: Payer: Self-pay | Admitting: Family Medicine

## 2019-10-04 DIAGNOSIS — M545 Low back pain, unspecified: Secondary | ICD-10-CM

## 2019-10-04 MED ORDER — AMPHETAMINE-DEXTROAMPHET ER 20 MG PO CP24: 20.0000 mg | ORAL_CAPSULE | ORAL | 0 refills | Status: DC

## 2019-10-04 NOTE — Telephone Encounter (Signed)
Spoke with patient and given information.

## 2019-10-10 ENCOUNTER — Ambulatory Visit: Payer: 59 | Admitting: Physical Therapy

## 2019-10-17 ENCOUNTER — Ambulatory Visit: Payer: 59

## 2019-10-17 ENCOUNTER — Ambulatory Visit (INDEPENDENT_AMBULATORY_CARE_PROVIDER_SITE_OTHER): Payer: 59 | Admitting: Psychiatry

## 2019-10-17 ENCOUNTER — Ambulatory Visit (INDEPENDENT_AMBULATORY_CARE_PROVIDER_SITE_OTHER): Payer: 59 | Admitting: Psychology

## 2019-10-17 ENCOUNTER — Encounter: Payer: Self-pay | Admitting: Psychiatry

## 2019-10-17 VITALS — HR 95

## 2019-10-17 DIAGNOSIS — F902 Attention-deficit hyperactivity disorder, combined type: Secondary | ICD-10-CM | POA: Diagnosis not present

## 2019-10-17 DIAGNOSIS — F419 Anxiety disorder, unspecified: Secondary | ICD-10-CM

## 2019-10-17 DIAGNOSIS — F4322 Adjustment disorder with anxiety: Secondary | ICD-10-CM | POA: Diagnosis not present

## 2019-10-17 MED ORDER — VORTIOXETINE HBR 10 MG PO TABS: 10.0000 mg | ORAL_TABLET | Freq: Every day | ORAL | 1 refills | Status: DC

## 2019-10-17 MED ORDER — AMPHETAMINE-DEXTROAMPHET ER 20 MG PO CP24: 20.0000 mg | ORAL_CAPSULE | ORAL | 0 refills | Status: DC

## 2019-10-17 NOTE — Progress Notes (Signed)
Chris Vega [id number] 1991/05/06 29 y.o.  Virtual Visit via Video Note  I connected with pt @ on 10/17/19 at  9:00 AM EST by a video enabled telemedicine application and verified that I am speaking with the correct person using two identifiers.   I discussed the limitations of evaluation and management by telemedicine and the availability of in person appointments. The patient expressed understanding and agreed to proceed.  I discussed the assessment and treatment plan with the patient. The patient was provided an opportunity to ask questions and all were answered. The patient agreed with the plan and demonstrated an understanding of the instructions.   The patient was advised to call back or seek an in-person evaluation if the symptoms worsen or if the condition fails to improve as anticipated.  I provided 30 minutes of non-face-to-face time during this encounter.  The patient was located at home.  The provider was located at [facility].   Corie Chiquito, PMHNP   Subjective:   Patient ID:  Chris Vega is a 29 y.o. (DOB 04/27/1991) male.  Chief Complaint:  Chief Complaint  Patient presents with  . Follow-up    ADD  . Anxiety    HPI Chris Vega presents for follow-up of ADD. He reports that he has noticed some improvement on Adderall XR 20 mg po qd. He reports that he had fewer side effects on 15mg . Notices dry mouth and jaw cliniching in the 20 mg dose. He reports that side effects are tolerable. He reports that he is able to sit down and be more productive on 20 mg. He has been taking medication around 7-8 am and lasts until 4-5 pm. He notices that he feels sleepy around that time and has to ask his wife to repeat herself.  He reports that he has been sleeping well and has not noticed any worsening insomnia. Reports that he sleeping slightly more.Taking 1-2 naps daily. He reports that his appetite is ok. He reports that he does intermittent fasting.  He reports that his anxiety is "about the same" and denies any worsening or improvement in anxiety since starting Adderall. He reports some work related anxiety. Denies any recent physical s/s of anxiety. He reports that he has not many social events or gatherings. He reports that his mood has been good. He reports that he is more interested on finance. Energy has been good. He reports that he continues to fidget frequently. Not pacing as much compared to the past. Motivation is "a constant struggle." Denies current depressed mood. Denies SI.   Wife is working for and is teaching late at [employer].  Past Psychiatric Medication Trials: Adderall XR- Caused insomnia in the past. Was prescribed 15 mg in the past. Recently took some that was remaining.  Adderall- Effective for about 2 years in college. Has not taken in about 3 years. May have taken 20 mg BID. Noticed some irritability towards the end of the day.  Xanax- Has used prn for panic. Has not taken in over a year Klonopin- Was not as effective for panic Lexapro- Took for a couple of weeks and then stopped taking it. Recalls feeling emotionally dulled. Buspar- Ineffective.   Review of Systems:  Review of Systems  Respiratory: Positive for cough.   Musculoskeletal: Positive for back pain and neck pain. Negative for gait problem.  Neurological: Negative for tremors.  Psychiatric/Behavioral:       Please refer to HPI    Medications: I have  reviewed the patient's current medications.  Current Outpatient Medications  Medication Sig Dispense Refill  . [START ON 11/01/2019] amphetamine-dextroamphetamine (ADDERALL XR) 20 MG 24 hr capsule Take 1 capsule (20 mg total) by mouth every morning. 30 capsule 0  . calcium carbonate (TUMS - DOSED IN MG ELEMENTAL CALCIUM) 500 MG chewable tablet Chew 1 tablet by mouth daily.    Marland Kitchen tiZANidine (ZANAFLEX) 4 MG tablet TAKE 1 TABLET (4 MG TOTAL) BY MOUTH AT BEDTIME AS NEEDED FOR MUSCLE SPASMS. 30  tablet 0  . ALPRAZolam (XANAX PO) Take by mouth.    . vortioxetine HBr (TRINTELLIX) 10 MG TABS tablet Take 1 tablet (10 mg total) by mouth daily. 30 tablet 1   No current facility-administered medications for this visit.    Medication Side Effects: Other: Dry mouth and jaw clinching  Allergies: No Known Allergies  Past Medical History:  Diagnosis Date  . Allergic rhinitis     Family History  Problem Relation Age of Onset  . Lung cancer Maternal Grandfather   . Breast cancer Paternal Grandmother   . OCD Mother     Social History   Socioeconomic History  . Marital status: Married    Spouse name: Not on file  . Number of children: Not on file  . Years of education: Not on file  . Highest education level: Not on file  Occupational History  . Not on file  Tobacco Use  . Smoking status: Never Smoker  . Smokeless tobacco: Former Network engineer and Sexual Activity  . Alcohol use: Yes    Comment: Reports infrequent ETOH use and will drink 3-4 drinks  . Drug use: Yes    Types: Marijuana  . Sexual activity: Not on file  Other Topics Concern  . Not on file  Social History Narrative  . Not on file   Social Determinants of Health   Financial Resource Strain:   . Difficulty of Paying Living Expenses: Not on file  Food Insecurity:   . Worried About Charity fundraiser in the Last Year: Not on file  . Ran Out of Food in the Last Year: Not on file  Transportation Needs:   . Lack of Transportation (Medical): Not on file  . Lack of Transportation (Non-Medical): Not on file  Physical Activity:   . Days of Exercise per Week: Not on file  . Minutes of Exercise per Session: Not on file  Stress:   . Feeling of Stress : Not on file  Social Connections:   . Frequency of Communication with Friends and Family: Not on file  . Frequency of Social Gatherings with Friends and Family: Not on file  . Attends Religious Services: Not on file  . Active Member of Clubs or Organizations:  Not on file  . Attends Archivist Meetings: Not on file  . Marital Status: Not on file  Intimate Partner Violence:   . Fear of Current or Ex-Partner: Not on file  . Emotionally Abused: Not on file  . Physically Abused: Not on file  . Sexually Abused: Not on file    Past Medical History, Surgical history, Social history, and Family history were reviewed and updated as appropriate.   Please see review of systems for further details on the patient's review from today.   Objective:   Physical Exam:  Pulse 95   Physical Exam Neurological:     Mental Status: He is alert and oriented to person, place, and time.     Cranial  Nerves: No dysarthria.  Psychiatric:        Attention and Perception: Attention and perception normal.        Mood and Affect: Mood is anxious.        Speech: Speech normal.        Behavior: Behavior is cooperative.        Thought Content: Thought content normal. Thought content is not paranoid or delusional. Thought content does not include homicidal or suicidal ideation. Thought content does not include homicidal or suicidal plan.        Cognition and Memory: Cognition and memory normal.        Judgment: Judgment normal.     Comments: Insight intact     Lab Review:  No results found for: NA, K, CL, CO2, GLUCOSE, BUN, CREATININE, CALCIUM, PROT, ALBUMIN, AST, ALT, ALKPHOS, BILITOT, GFRNONAA, GFRAA  No results found for: WBC, RBC, HGB, HCT, PLT, MCV, MCH, MCHC, RDW, LYMPHSABS, MONOABS, EOSABS, BASOSABS  No results found for: POCLITH, LITHIUM   No results found for: PHENYTOIN, PHENOBARB, VALPROATE, CBMZ   .res Assessment: Plan:   Patient seen for 30 minutes and discussed treatment options for anxiety and depression since patient reports having significant anxiety signs and symptoms related to his work, as well as chronic low motivation.  Discussed that patient had affective dulling in early treatment with Lexapro, and therefore should consider  medication with low risk of affective dulling.  Discussed potential benefits, risks, and side effects of Trintellix.  Discussed that Trintellix is approved for depression and also has indication to improve cognitive processing speed.  Will start Trintellix 10 mg daily with food for mood and anxiety. Will continue Adderall XR 20 mg daily since patient reports that benefits are currently outweighing side effects. Recommend continuing psychotherapy. Patient to follow-up in 4 weeks or sooner if clinically indicated. Patient advised to contact office with any questions, adverse effects, or acute worsening in signs and symptoms.  Jayceon was seen today for follow-up and anxiety.  Diagnoses and all orders for this visit:  Anxiety disorder, unspecified type -     vortioxetine HBr (TRINTELLIX) 10 MG TABS tablet; Take 1 tablet (10 mg total) by mouth daily.  Attention deficit hyperactivity disorder (ADHD), combined type -     amphetamine-dextroamphetamine (ADDERALL XR) 20 MG 24 hr capsule; Take 1 capsule (20 mg total) by mouth every morning.     Please see After Visit Summary for patient specific instructions.  Future Appointments  Date Time Provider Department Center  10/31/2019  2:00 PM Karie Kirks [facility] None  11/14/2019  9:00 AM Corie Chiquito, PMHNP [facility] None  11/14/2019  2:00 PM Cottle, Sheppard Plumber, [facility] None    No orders of the defined types were placed in this encounter.     -------------------------------

## 2019-10-31 ENCOUNTER — Encounter: Payer: Self-pay | Admitting: Physical Therapy

## 2019-10-31 ENCOUNTER — Other Ambulatory Visit: Payer: Self-pay

## 2019-10-31 ENCOUNTER — Ambulatory Visit (INDEPENDENT_AMBULATORY_CARE_PROVIDER_SITE_OTHER): Payer: 59 | Admitting: Psychology

## 2019-10-31 ENCOUNTER — Ambulatory Visit: Payer: 59 | Attending: Family Medicine | Admitting: Physical Therapy

## 2019-10-31 DIAGNOSIS — M62830 Muscle spasm of back: Secondary | ICD-10-CM | POA: Diagnosis present

## 2019-10-31 DIAGNOSIS — M545 Low back pain, unspecified: Secondary | ICD-10-CM

## 2019-10-31 DIAGNOSIS — F4322 Adjustment disorder with anxiety: Secondary | ICD-10-CM | POA: Diagnosis not present

## 2019-10-31 NOTE — Patient Instructions (Signed)
Pelvic Press     Place hands under belly between navel and pubic bone, palms up. Feel pressure on hands. Increase pressure on hands by pressing pelvis down. This is NOT a pelvic tilt. Hold __5_ seconds. Relax. Repeat _10__ times. Once a day.  Leg Lift: One-Leg   Press pelvis down. Keep knee straight; lengthen and lift one leg (from waist). Do not twist body. Keep other leg down. Hold _1__ seconds. Relax. Repeat 10 time. Repeat with other leg.   Trigger Point Dry Needling  . What is Trigger Point Dry Needling (DN)? o DN is a physical therapy technique used to treat muscle pain and dysfunction. Specifically, DN helps deactivate muscle trigger points (muscle knots).  o A thin filiform needle is used to penetrate the skin and stimulate the underlying trigger point. The goal is for a local twitch response (LTR) to occur and for the trigger point to relax. No medication of any kind is injected during the procedure.   . What Does Trigger Point Dry Needling Feel Like?  o The procedure feels different for each individual patient. Some patients report that they do not actually feel the needle enter the skin and overall the process is not painful. Very mild bleeding may occur. However, many patients feel a deep cramping in the muscle in which the needle was inserted. This is the local twitch response.   Marland Kitchen How Will I feel after the treatment? o Soreness is normal, and the onset of soreness may not occur for a few hours. Typically this soreness does not last longer than two days.  o Bruising is uncommon, however; ice can be used to decrease any possible bruising.  o In rare cases feeling tired or nauseous after the treatment is normal. In addition, your symptoms may get worse before they get better, this period will typically not last longer than 24 hours.   . What Can I do After My Treatment? o Increase your hydration by drinking more water for the next 24 hours. o You may place ice or heat on the  areas treated that have become sore, however, do not use heat on inflamed or bruised areas. Heat often brings more relief post needling. o You can continue your regular activities, but vigorous activity is not recommended initially after the treatment for 24 hours. o DN is best combined with other physical therapy such as strengthening, stretching, and other therapies.     Solon Palm, PT 10/31/19 9:23 AM [facility] [address], [address] [place], [place] [address] Phone # [phone] Fax [phone]

## 2019-10-31 NOTE — Therapy (Signed)
[facility] [address], [place], [place], [address] Phone: [phone]   Fax:  [phone]  Physical Therapy Evaluation  Patient Details  Name: Chris Vega MRN: [id number] Date of Birth: 08-31-1990 Referring Provider (PT): Betty Martinique, MD   Encounter Date: 10/31/2019  PT End of Session - 10/31/19 0848    Visit Number  1    Date for PT Re-Evaluation  11/28/19    Authorization Type  [employer]    PT Start Time  0848    PT Stop Time  0926    PT Time Calculation (min)  38 min    Activity Tolerance  Patient tolerated treatment well    Behavior During Therapy  [facility] for tasks assessed/performed       Past Medical History:  Diagnosis Date  . Allergic rhinitis     History reviewed. No pertinent surgical history.  There were no vitals filed for this visit.   Subjective Assessment - 10/31/19 0850    Subjective  In mid December, he began exercising again and his low back began hurting. He has now stopped. There is one vertebra that is tender to the touch and also hurts when I bend over. He has had upper back pain in the past and also has neck pain.    Pertinent History  neck pain, upper back pain, anxiety, ADHD    Diagnostic tests  xrays    Patient Stated Goals  to get rid of pain    Currently in Pain?  Yes    Pain Score  5    in the morning   Pain Location  Back    Pain Orientation  Lower    Pain Descriptors / Indicators  Sharp    Pain Type  Acute pain    Pain Onset  More than a month ago    Pain Frequency  Intermittent    Aggravating Factors   in the morning, bending    Pain Relieving Factors  rest    Effect of Pain on Daily Activities  limiting exercise         [facility] PT Assessment - 10/31/19 0001      Assessment   Medical Diagnosis  midline LBP wihthout sciatica    Referring Provider (PT)  Betty Martinique, MD    Onset Date/Surgical Date  08/08/19      Precautions   Precautions  None      Restrictions   Weight Bearing Restrictions  No      Balance Screen   Has the patient fallen in the past 6 months  No    Has the patient had a decrease in activity level because of a fear of falling?   No    Is the patient reluctant to leave their home because of a fear of falling?   No      Home Film/video editor residence      Prior Function   Level of Independence  Independent    Vocation  Full time employment    Vocation Requirements  standing mostly    Leisure  get back to exercise; yoga      ROM / Strength   AROM / PROM / Strength  AROM;Strength      AROM   Overall AROM Comments  Full lumbar (hyper mobile)      Strength   Overall Strength Comments  Grossly 4+/5 in BLE, core weakness with MMT  Palpation   Spinal mobility  hypermobile lumbar pain/spasm in left L2/3, L1/2, L3/4; mild spasm at right L1/2    Palpation comment  tender in upper lumbar paraspinals  and left QL                Objective measurements completed on examination: See above findings.      [facility] Adult PT Treatment/Exercise - 10/31/19 0001      Exercises   Exercises  Lumbar      Lumbar Exercises: Prone   Straight Leg Raise  5 reps    Opposite Arm/Leg Raise  5 reps    Other Prone Lumbar Exercises  pelvic press x 2 with 5 sec hold      Manual Therapy   Manual Therapy  Soft tissue mobilization    Manual therapy comments  Skilled palpation and monitoring of soft tissues during DN        Trigger Point Dry Needling - 10/31/19 0001    Consent Given?  Yes    Education Handout Provided  Yes    Muscles Treated Back/Hip  Lumbar multifidi;Quadratus lumborum    Lumbar multifidi Response  Twitch response elicited    Quadratus Lumborum Response  Palpable increased muscle length           PT Education - 10/31/19 0923    Education Details  HEP and DN education    Person(s) Educated  Patient    Methods  Explanation;Demonstration;Handout    Comprehension   Verbalized understanding       PT Short Term Goals - 10/31/19 1959      PT SHORT TERM GOAL #1   Title  Ind with initial HEP    Time  2    Period  Weeks    Status  New    Target Date  11/14/19        PT Long Term Goals - 10/31/19 1959      PT LONG TERM GOAL #1   Title  Patient to report improvement in back pain in the morning by 90% or more.    Time  4    Period  Weeks    Status  New    Target Date  11/28/19      PT LONG TERM GOAL #2   Title  Patient to demonstrate independence with spinal stabilization and core strengthening to normalize posture and prevent further injury.    Time  4    Period  Weeks    Status  New      PT LONG TERM GOAL #3   Title  Patient to demo and understand correct body mechanics for protecting the low back with bending and lifting.    Time  4    Period  Weeks    Status  New             Plan - 10/31/19 0933    Clinical Impression Statement  Patient presents with c/o LBP starting in mid December 2020 after starting back to exercise. Patient now has intermittent pain with bending and mostly in the morning. He is hypermobile throughout his spine and has spasms in left paraspinals with palpation. Also in Left QL.  He also reports intermittent left neck pain for years. He stands with increased lordosis. Patient will benefit from PT to decrease pain and spasms and strengthen his spinal stabilizers to prevent further injury. DN was performed today in the left lumbar multifidi and left QL. Patient had significant twitch response in the  multifidi.    Stability/Clinical Decision Making  Stable/Uncomplicated    Clinical Decision Making  Low    Rehab Potential  Excellent    PT Frequency  2x / week    PT Duration  4 weeks    PT Treatment/Interventions  ADLs/Self Care Home Management;Electrical Stimulation;Cryotherapy;Moist Heat;Traction;Therapeutic exercise;Therapeutic activities;Neuromuscular re-education;Patient/family education;Manual techniques;Dry  needling;Taping;Spinal Manipulations    PT Next Visit Plan  Assess DN #1, cont to lower thoracic/lumbar prn; STW/Man to same; work on neutral spine in standing, core, and spinal stabilization.    PT Home Exercise Plan  prone pelvic press and prone alt leg raise    Consulted and Agree with Plan of Care  Patient       Patient will benefit from skilled therapeutic intervention in order to improve the following deficits and impairments:  Increased muscle spasms, Pain, Postural dysfunction, Decreased strength, Hypermobility  Visit Diagnosis: Acute midline low back pain without sciatica - Plan: PT plan of care cert/re-cert  Muscle spasm of back - Plan: PT plan of care cert/re-cert     Problem List Patient Active Problem List   Diagnosis Date Noted  . ADHD (attention deficit hyperactivity disorder), combined type 09/12/2019  . Anxiety disorder, unspecified 09/12/2019    Solon Palm PT 10/31/2019, 8:07 PM  [facility] [address], [address] [place], [place], [address] Phone: [phone]   Fax:  [phone]  Name: Harith Mccadden MRN: [id number] Date of Birth: Feb 15, 1991

## 2019-11-01 ENCOUNTER — Other Ambulatory Visit: Payer: Self-pay

## 2019-11-01 ENCOUNTER — Ambulatory Visit: Payer: 59

## 2019-11-01 DIAGNOSIS — M62830 Muscle spasm of back: Secondary | ICD-10-CM

## 2019-11-01 DIAGNOSIS — M545 Low back pain, unspecified: Secondary | ICD-10-CM

## 2019-11-01 NOTE — Patient Instructions (Signed)
Access Code: [email or web address] URL: [email or web address] Date: 11/01/2019 Prepared by: Lorrene Reid  Exercises Supine Transversus Abdominis Bracing with Double Leg Fallout - 10 reps - 2 sets - 2x daily - 7x weekly Seated Transversus Abdominis Bracing - 10 reps - 3 sets - 1x daily - 7x weekly Supine Transversus Abdominis Bracing - Hands on Ground - 10 reps - 2 sets - 5 hold - 1x daily - 7x weekly Clamshell - 10 reps - 2 sets - 1x daily - 7x weekly

## 2019-11-01 NOTE — Therapy (Addendum)
[facility] [address] [place], [place], [address] Phone: [phone]   Fax:  [phone]  Physical Therapy Treatment  Patient Details  Name: Chris Vega MRN: [id number] Date of Birth: May 17, 1991 Referring Provider (PT): Betty Martinique, MD   Encounter Date: 11/01/2019  PT End of Session - 11/01/19 1700    Visit Number  2    Date for PT Re-Evaluation  11/28/19    Authorization Type  [employer]    PT Start Time  0417    PT Stop Time  0458    PT Time Calculation (min)  41 min    Activity Tolerance  Patient tolerated treatment well    Behavior During Therapy  [facility] for tasks assessed/performed       Past Medical History:  Diagnosis Date  . Allergic rhinitis     History reviewed. No pertinent surgical history.  There were no vitals filed for this visit.  Subjective Assessment - 11/01/19 1619    Subjective  I am feeling good after dry needling    Currently in Pain?  Yes    Pain Score  1     Pain Location  Back    Pain Orientation  Lower                       [facility] Adult PT Treatment/Exercise - 11/01/19 0001      Lumbar Exercises: Supine   Ab Set  20 reps    AB Set Limitations  with ball squeeze    Clam  20 reps    Clam Limitations  with abdominal bracing      Lumbar Exercises: Sidelying   Clam  Both;20 reps      Lumbar Exercises: Prone   Straight Leg Raise  10 reps    Opposite Arm/Leg Raise  10 reps    Other Prone Lumbar Exercises  pelvic press x 2 with 5 sec hold      Lumbar Exercises: Quadruped   Madcat/Old Horse  10 reps    Madcat/Old Horse Limitations  emphasis on avoiding hyperextension at lumbar spine and increasing movement at thoracic spine into extension      Manual Therapy   Manual Therapy  Joint mobilization    Manual therapy comments  PA and rotational mobs at T12 and L1             PT Education - 11/01/19 1643    Education Details  Access Code: [email or web address]    Person(s) Educated  Patient    Methods  Explanation;Demonstration;Handout    Comprehension  Verbalized understanding;Returned demonstration       PT Short Term Goals - 10/31/19 1959      PT SHORT TERM GOAL #1   Title  Ind with initial HEP    Time  2    Period  Weeks    Status  New    Target Date  11/14/19        PT Long Term Goals - 10/31/19 1959      PT LONG TERM GOAL #1   Title  Patient to report improvement in back pain in the morning by 90% or more.    Time  4    Period  Weeks    Status  New    Target Date  11/28/19      PT LONG TERM GOAL #2   Title  Patient to demonstrate independence with spinal stabilization and core strengthening to normalize posture  and prevent further injury.    Time  4    Period  Weeks    Status  New      PT LONG TERM GOAL #3   Title  Patient to demo and understand correct body mechanics for protecting the low back with bending and lifting.    Time  4    Period  Weeks    Status  New            Plan - 11/01/19 1621    Clinical Impression Statement  Pt with first time follow-up after evaluation yesterday.  Pt denies any lumbar pain at this time.  Pt reports some stiffness after dry needling yesterday.  Pt is working on his alignment in sitting and standing.  Session focused on review of HEP and addition of neutral spine strength and flexibility exercises.  Pt required minor verbal cues for alignment.  Pt will continue to benefit from skilled PT to address neutral spine, muscle tension and flexibility.    PT Frequency  2x / week    PT Duration  4 weeks    PT Treatment/Interventions  ADLs/Self Care Home Management;Electrical Stimulation;Cryotherapy;Moist Heat;Traction;Therapeutic exercise;Therapeutic activities;Neuromuscular re-education;Patient/family education;Manual techniques;Dry needling;Taping;Spinal Manipulations    PT Next Visit Plan  dry needling to gluteals and lumbar/thoracic spine, core and spinal stabilization.    Consulted and  Agree with Plan of Care  Patient       Patient will benefit from skilled therapeutic intervention in order to improve the following deficits and impairments:  Increased muscle spasms, Pain, Postural dysfunction, Decreased strength, Hypermobility  Visit Diagnosis: Muscle spasm of back  Acute midline low back pain without sciatica     Problem List Patient Active Problem List   Diagnosis Date Noted  . ADHD (attention deficit hyperactivity disorder), combined type 09/12/2019  . Anxiety disorder, unspecified 09/12/2019     Sigurd Sos, PT 11/01/19 5:01 PM PHYSICAL THERAPY DISCHARGE SUMMARY  Visits from Start of Care: 2  Current functional level related to goals / functional outcomes: See above for current status.  Pt no-showed and canceled all remaining appointments.     Remaining deficits: See above for current status.     Education / Equipment: HEP  Plan: Patient agrees to discharge.  Patient goals were not met. Patient is being discharged due to the patient's request.  ?????        Sigurd Sos, PT 11/22/19 6:58 AM  [facility] [address] [place], [place], [address] Phone: [phone]   Fax:  [phone]  Name: Ercil Cassis MRN: [id number] Date of Birth: April 11, 1991

## 2019-11-14 ENCOUNTER — Encounter: Payer: Self-pay | Admitting: Psychiatry

## 2019-11-14 ENCOUNTER — Other Ambulatory Visit: Payer: Self-pay

## 2019-11-14 ENCOUNTER — Ambulatory Visit (INDEPENDENT_AMBULATORY_CARE_PROVIDER_SITE_OTHER): Payer: 59 | Admitting: Psychology

## 2019-11-14 ENCOUNTER — Ambulatory Visit (INDEPENDENT_AMBULATORY_CARE_PROVIDER_SITE_OTHER): Payer: 59 | Admitting: Psychiatry

## 2019-11-14 DIAGNOSIS — F902 Attention-deficit hyperactivity disorder, combined type: Secondary | ICD-10-CM

## 2019-11-14 DIAGNOSIS — F419 Anxiety disorder, unspecified: Secondary | ICD-10-CM | POA: Diagnosis not present

## 2019-11-14 DIAGNOSIS — F4322 Adjustment disorder with anxiety: Secondary | ICD-10-CM | POA: Diagnosis not present

## 2019-11-14 MED ORDER — VORTIOXETINE HBR 20 MG PO TABS: 20.0000 mg | ORAL_TABLET | Freq: Every day | ORAL | 1 refills | Status: DC

## 2019-11-14 MED ORDER — AMPHETAMINE-DEXTROAMPHET ER 20 MG PO CP24: 20.0000 mg | ORAL_CAPSULE | Freq: Every day | ORAL | 0 refills | Status: DC

## 2019-11-14 MED ORDER — AMPHETAMINE-DEXTROAMPHET ER 20 MG PO CP24: 20.0000 mg | ORAL_CAPSULE | ORAL | 0 refills | Status: DC

## 2019-11-14 NOTE — Progress Notes (Signed)
Chris Vega [place] [id number] 04-19-1991 29 y.o.  Subjective:   Patient ID:  Chris Vega is a 29 y.o. (DOB 1990-10-04) male.  Chief Complaint:  Chief Complaint  Patient presents with  . ADD  . Anxiety    HPI Chris Vega presents to the office today for follow-up of ADD and Anxiety. He reports that he feels Adderall is helpful for concentration. Noticed improved response with Adderall XR 20 mg. Continues to notice some challenges with concentration. Feels that medication is effective from 7 am until about 3-4 pm. He reports that he had n/v on the first few days after starting Trintellix and this resolved. He denies any significant changes in anxiety. Has not been in many social situations and has avoided these situations because of anxiety and the pandemic. Denies significant worry. Continues to [employer] and pace. Denies any other physical s/s of anxiety. He reports that his mood has been ok. Continues to have some difficulty with motivation. Energy has been about the same. Has been getting out more with the warmer weather. Sleep has improved over the last few days. Typically does not have difficulty getting to sleep. Has occ difficulty returning to sleep. Averaging about 6 hours of sleep a night. Appetite has been ok. He reports that his wife has noticed some positive changes and commented that he is reminding her to do things instead of the other way around. Denies SI.   Wife just moved to [place] until June. Continues to see therapist regularly.   Reduced hours at work.   Past Psychiatric Medication Trials: Adderall XR- Caused insomnia in the past. Was prescribed 15 mg in the past. Recently took some that was remaining.  Adderall- Effective for about 2 years in college. Has not taken in about 3 years. May have taken 20 mg BID. Noticed some irritability towards the end of the day.  Xanax- Has used prn for panic. Has not taken in over a year Klonopin- Was not as effective for  panic Lexapro- Took for a couple of weeks and then stopped taking it. Recalls feeling emotionally dulled. Buspar- Ineffective.  PHQ2-9     Office Visit from 08/25/2016 in Primary Care at [facility] Total Score  0       Review of Systems:  Review of Systems  Cardiovascular: Negative for palpitations.  Musculoskeletal: Negative for gait problem.  Neurological: Negative for tremors.  Psychiatric/Behavioral:       Please refer to HPI    Medications: I have reviewed the patient's current medications.  Current Outpatient Medications  Medication Sig Dispense Refill  . [START ON 12/29/2019] amphetamine-dextroamphetamine (ADDERALL XR) 20 MG 24 hr capsule Take 1 capsule (20 mg total) by mouth every morning. 30 capsule 0  . calcium carbonate (TUMS - DOSED IN MG ELEMENTAL CALCIUM) 500 MG chewable tablet Chew 1 tablet by mouth daily.    Marland Kitchen tiZANidine (ZANAFLEX) 4 MG tablet TAKE 1 TABLET (4 MG TOTAL) BY MOUTH AT BEDTIME AS NEEDED FOR MUSCLE SPASMS. 30 tablet 0  . vortioxetine HBr (TRINTELLIX) 20 MG TABS tablet Take 1 tablet (20 mg total) by mouth daily. 30 tablet 1  . [START ON 12/01/2019] amphetamine-dextroamphetamine (ADDERALL XR) 20 MG 24 hr capsule Take 1 capsule (20 mg total) by mouth daily. 30 capsule 0   No current facility-administered medications for this visit.    Medication Side Effects: Other: Initially had some n/v with Trintellix initially. Jaw clinching has improved.   Allergies: No Known Allergies  Past Medical History:  Diagnosis Date  . Allergic rhinitis     Family History  Problem Relation Age of Onset  . Lung cancer Maternal Grandfather   . Breast cancer Paternal Grandmother   . OCD Mother     Social History   Socioeconomic History  . Marital status: Married    Spouse name: Not on file  . Number of children: Not on file  . Years of education: Not on file  . Highest education level: Not on file  Occupational History  . Not on file  Tobacco Use  . Smoking  status: Never Smoker  . Smokeless tobacco: Former Engineer, water and Sexual Activity  . Alcohol use: Yes    Comment: Reports infrequent ETOH use and will drink 3-4 drinks  . Drug use: Yes    Types: Marijuana  . Sexual activity: Not on file  Other Topics Concern  . Not on file  Social History Narrative  . Not on file   Social Determinants of Health   Financial Resource Strain:   . Difficulty of Paying Living Expenses:   Food Insecurity:   . Worried About Programme researcher, broadcasting/film/video in the Last Year:   . Barista in the Last Year:   Transportation Needs:   . Freight forwarder (Medical):   Marland Kitchen Lack of Transportation (Non-Medical):   Physical Activity:   . Days of Exercise per Week:   . Minutes of Exercise per Session:   Stress:   . Feeling of Stress :   Social Connections:   . Frequency of Communication with Friends and Family:   . Frequency of Social Gatherings with Friends and Family:   . Attends Religious Services:   . Active Member of Clubs or Organizations:   . Attends Banker Meetings:   Marland Kitchen Marital Status:   Intimate Partner Violence:   . Fear of Current or Ex-Partner:   . Emotionally Abused:   Marland Kitchen Physically Abused:   . Sexually Abused:     Past Medical History, Surgical history, Social history, and Family history were reviewed and updated as appropriate.   Please see review of systems for further details on the patient's review from today.   Objective:   Physical Exam:  BP 106/64   Pulse 67   Wt 164 lb (74.4 kg)   BMI 25.69 kg/m   Physical Exam Constitutional:      General: He is not in acute distress. Musculoskeletal:        General: No deformity.  Neurological:     Mental Status: He is alert and oriented to person, place, and time.     Coordination: Coordination normal.  Psychiatric:        Attention and Perception: Attention and perception normal. He does not perceive auditory or visual hallucinations.        Mood and Affect: Mood  is anxious. Mood is not depressed. Affect is not labile, blunt, angry or inappropriate.        Speech: Speech normal.        Behavior: Behavior normal.        Thought Content: Thought content normal. Thought content is not paranoid or delusional. Thought content does not include homicidal or suicidal ideation. Thought content does not include homicidal or suicidal plan.        Cognition and Memory: Cognition and memory normal.        Judgment: Judgment normal.     Comments: Insight intact     Lab Review:  No results found for: NA, K, CL, CO2, GLUCOSE, BUN, CREATININE, CALCIUM, PROT, ALBUMIN, AST, ALT, ALKPHOS, BILITOT, GFRNONAA, GFRAA  No results found for: WBC, RBC, HGB, HCT, PLT, MCV, MCH, MCHC, RDW, LYMPHSABS, MONOABS, EOSABS, BASOSABS  No results found for: POCLITH, LITHIUM   No results found for: PHENYTOIN, PHENOBARB, VALPROATE, CBMZ   .res Assessment: Plan:   Will increase Trintellix to 20 mg po qd since pt has noticed a partial response in anxiety and possibly mood s/s with lower dose of Trintellix with continued low motivation.  Will continue Adderall XR 20 mg po qd for ADHD.  Pt to f/u in 2 months or sooner if clinically indicated. Recommend continuing therapy. Patient advised to contact office with any questions, adverse effects, or acute worsening in signs and symptoms.  Chris Vega was seen today for add and anxiety.  Diagnoses and all orders for this visit:  Anxiety disorder, unspecified type -     vortioxetine HBr (TRINTELLIX) 20 MG TABS tablet; Take 1 tablet (20 mg total) by mouth daily.  Attention deficit hyperactivity disorder (ADHD), combined type -     amphetamine-dextroamphetamine (ADDERALL XR) 20 MG 24 hr capsule; Take 1 capsule (20 mg total) by mouth every morning. -     amphetamine-dextroamphetamine (ADDERALL XR) 20 MG 24 hr capsule; Take 1 capsule (20 mg total) by mouth daily.     Please see After Visit Summary for patient specific instructions.  Future  Appointments  Date Time Provider Department Center  11/15/2019  4:15 PM Edrick Oh, PT [facility]  11/22/2019  4:15 PM Edrick Oh, PT [facility]  11/28/2019  9:30 AM Riddles, Enrique Sack, PT [facility]  11/28/2019  2:00 PM Karie Kirks, [facility] None  11/29/2019  4:15 PM Edrick Oh, PT [facility]  12/26/2019  9:30 AM Corie Chiquito, [facility] None    No orders of the defined types were placed in this encounter.   -------------------------------

## 2019-11-15 ENCOUNTER — Ambulatory Visit: Payer: 59

## 2019-11-15 ENCOUNTER — Telehealth: Payer: Self-pay

## 2019-11-15 NOTE — Telephone Encounter (Signed)
PT left voicemail for patient due to no-show appointment today.

## 2019-11-21 ENCOUNTER — Other Ambulatory Visit: Payer: Self-pay | Admitting: Family Medicine

## 2019-11-21 DIAGNOSIS — M545 Low back pain, unspecified: Secondary | ICD-10-CM

## 2019-11-22 ENCOUNTER — Ambulatory Visit: Payer: 59

## 2019-11-27 ENCOUNTER — Telehealth: Payer: Self-pay

## 2019-11-27 NOTE — Telephone Encounter (Signed)
Prior authorization submitted and approved for TRINTELLIX 20 MG effective 11/23/2019-11/22/2020 through [employer]

## 2019-11-28 ENCOUNTER — Ambulatory Visit (INDEPENDENT_AMBULATORY_CARE_PROVIDER_SITE_OTHER): Payer: Self-pay | Admitting: Psychology

## 2019-11-28 ENCOUNTER — Encounter: Payer: 59 | Admitting: Physical Therapy

## 2019-11-28 DIAGNOSIS — F4322 Adjustment disorder with anxiety: Secondary | ICD-10-CM

## 2019-12-04 ENCOUNTER — Telehealth: Payer: Self-pay | Admitting: Psychiatry

## 2019-12-04 ENCOUNTER — Other Ambulatory Visit: Payer: Self-pay

## 2019-12-04 DIAGNOSIS — F902 Attention-deficit hyperactivity disorder, combined type: Secondary | ICD-10-CM

## 2019-12-04 NOTE — Telephone Encounter (Signed)
Pended [facility] for update [facility] [address]

## 2019-12-04 NOTE — Telephone Encounter (Signed)
Pt called to advise [facility] don't have Adderall  XR 20 mg. Please [facility] to [facility] [address]

## 2019-12-05 MED ORDER — AMPHETAMINE-DEXTROAMPHET ER 20 MG PO CP24: 20.0000 mg | ORAL_CAPSULE | ORAL | 0 refills | Status: DC

## 2019-12-12 ENCOUNTER — Ambulatory Visit (INDEPENDENT_AMBULATORY_CARE_PROVIDER_SITE_OTHER): Payer: 59 | Admitting: Psychology

## 2019-12-12 DIAGNOSIS — F4322 Adjustment disorder with anxiety: Secondary | ICD-10-CM | POA: Diagnosis not present

## 2019-12-26 ENCOUNTER — Ambulatory Visit (INDEPENDENT_AMBULATORY_CARE_PROVIDER_SITE_OTHER): Payer: Self-pay | Admitting: Psychology

## 2019-12-26 ENCOUNTER — Ambulatory Visit (INDEPENDENT_AMBULATORY_CARE_PROVIDER_SITE_OTHER): Payer: 59 | Admitting: Psychiatry

## 2019-12-26 ENCOUNTER — Encounter: Payer: Self-pay | Admitting: Psychiatry

## 2019-12-26 ENCOUNTER — Other Ambulatory Visit: Payer: Self-pay

## 2019-12-26 DIAGNOSIS — F419 Anxiety disorder, unspecified: Secondary | ICD-10-CM

## 2019-12-26 DIAGNOSIS — F902 Attention-deficit hyperactivity disorder, combined type: Secondary | ICD-10-CM

## 2019-12-26 DIAGNOSIS — F4322 Adjustment disorder with anxiety: Secondary | ICD-10-CM

## 2019-12-26 MED ORDER — VORTIOXETINE HBR 20 MG PO TABS: 20.0000 mg | ORAL_TABLET | Freq: Every day | ORAL | 1 refills | Status: DC

## 2019-12-26 MED ORDER — AMPHETAMINE-DEXTROAMPHET ER 25 MG PO CP24: 25.0000 mg | ORAL_CAPSULE | ORAL | 0 refills | Status: DC

## 2019-12-26 NOTE — Progress Notes (Signed)
Efraim Vanallen [place] [id number] Nov 03, 1990 29 y.o.  Subjective:   Patient ID:  Chris Vega is a 29 y.o. (DOB February 15, 1991) male.  Chief Complaint:  Chief Complaint  Patient presents with  . Other    Low motivation  . ADD  . Follow-up    Depression, anxiety    HPI Chris Vega presents to the office today for follow-up of depression, ADHD, and anxiety.  He reports that he has not noticed a significant change with increase in Trintellix. He reports that there was a brief gap due to insurance issues. He reports that he had some severe nausea and some vomiting in the morning after taking Trintellix the night before. He continues to have some nausea. "I don't feel particularly sad or depressed. I'm having a hard time doing anything other than the gardening." He reports that motivation is low for anything other than gardening. He reports that he eats one large meal daily in the evening and some snacks. Denies low energy. Has noticed some improvement in sleep and is not sure if this is related to gardening or increase in Trintellix. Usually gardening when he would otherwise would be napping. He also enjoys keeping up with the stock market. He reports that anxiety has not been bothering him recently and that it is mostly work related or social anxiety. Has not been in many social situations. Has been seeking out some social interaction with wife being away. Concentration has been decreased. Notices some distractibility. Losing track of where he is in conversation. Notices some decrease in concentration. Denies SI.   Now living alone with wife working in [place]. Learned that wife may be there through the summer. Was laid off from work. Has been planning on returning to school.   Past Psychiatric Medication Trials: Adderall XR- Caused insomnia in the past. Was prescribed 15 mg in the past. Recently took some that was remaining.  Adderall- Effective for about 2 years in college. Has not taken  in about 3 years. May have taken 20 mg BID. Noticed some irritability towards the end of the day.  Ritalin- Took for 30 days. Noticed increased libido.  Xanax- Has used prn for panic. Has not taken in over a year Klonopin- Was not as effective for panic Lexapro- Took for a couple of weeks and then stopped taking it. Recalls feeling emotionally dulled. Trintellix Buspar- Ineffective.  PHQ2-9     Office Visit from 08/25/2016 in Primary Care at [facility] Total Score  0       Review of Systems:  Review of Systems  Cardiovascular: Negative for palpitations.  Gastrointestinal: Positive for nausea.  Musculoskeletal: Positive for back pain. Negative for gait problem.  Neurological: Negative for tremors.  Psychiatric/Behavioral:       Please refer to HPI    Medications: I have reviewed the patient's current medications.  Current Outpatient Medications  Medication Sig Dispense Refill  . [START ON 12/29/2019] amphetamine-dextroamphetamine (ADDERALL XR) 25 MG 24 hr capsule Take 1 capsule by mouth every morning. 30 capsule 0  . tiZANidine (ZANAFLEX) 4 MG tablet Take 1 tablet (4 mg total) by mouth at bedtime as needed for muscle spasms. 30 tablet 0  . vortioxetine HBr (TRINTELLIX) 20 MG TABS tablet Take 1 tablet (20 mg total) by mouth daily. 30 tablet 1   No current facility-administered medications for this visit.    Medication Side Effects: Nausea  Allergies: No Known Allergies  Past Medical History:  Diagnosis Date  . Allergic rhinitis  Family History  Problem Relation Age of Onset  . Lung cancer Maternal Grandfather   . Breast cancer Paternal Grandmother   . OCD Mother     Social History   Socioeconomic History  . Marital status: Married    Spouse name: Not on file  . Number of children: Not on file  . Years of education: Not on file  . Highest education level: Not on file  Occupational History  . Not on file  Tobacco Use  . Smoking status: Never Smoker  .  Smokeless tobacco: Former Engineer, water and Sexual Activity  . Alcohol use: Yes    Comment: Reports infrequent ETOH use and will drink 3-4 drinks  . Drug use: Yes    Types: Marijuana  . Sexual activity: Not on file  Other Topics Concern  . Not on file  Social History Narrative  . Not on file   Social Determinants of Health   Financial Resource Strain:   . Difficulty of Paying Living Expenses:   Food Insecurity:   . Worried About Programme researcher, broadcasting/film/video in the Last Year:   . Barista in the Last Year:   Transportation Needs:   . Freight forwarder (Medical):   Marland Kitchen Lack of Transportation (Non-Medical):   Physical Activity:   . Days of Exercise per Week:   . Minutes of Exercise per Session:   Stress:   . Feeling of Stress :   Social Connections:   . Frequency of Communication with Friends and Family:   . Frequency of Social Gatherings with Friends and Family:   . Attends Religious Services:   . Active Member of Clubs or Organizations:   . Attends Banker Meetings:   Marland Kitchen Marital Status:   Intimate Partner Violence:   . Fear of Current or Ex-Partner:   . Emotionally Abused:   Marland Kitchen Physically Abused:   . Sexually Abused:     Past Medical History, Surgical history, Social history, and Family history were reviewed and updated as appropriate.   Please see review of systems for further details on the patient's review from today.   Objective:   Physical Exam:  There were no vitals taken for this visit.  Physical Exam Constitutional:      General: He is not in acute distress. Musculoskeletal:        General: No deformity.  Neurological:     Mental Status: He is alert and oriented to person, place, and time.     Coordination: Coordination normal.  Psychiatric:        Attention and Perception: Attention and perception normal. He does not perceive auditory or visual hallucinations.        Mood and Affect: Mood is anxious and depressed. Affect is not labile,  blunt, angry or inappropriate.        Speech: Speech normal.        Behavior: Behavior normal.        Thought Content: Thought content normal. Thought content is not paranoid or delusional. Thought content does not include homicidal or suicidal ideation. Thought content does not include homicidal or suicidal plan.        Cognition and Memory: Cognition and memory normal.        Judgment: Judgment normal.     Comments: Insight intact     Lab Review:  No results found for: NA, K, CL, CO2, GLUCOSE, BUN, CREATININE, CALCIUM, PROT, ALBUMIN, AST, ALT, ALKPHOS, BILITOT, GFRNONAA, GFRAA  No  results found for: WBC, RBC, HGB, HCT, PLT, MCV, MCH, MCHC, RDW, LYMPHSABS, MONOABS, EOSABS, BASOSABS  No results found for: POCLITH, LITHIUM   No results found for: PHENYTOIN, PHENOBARB, VALPROATE, CBMZ   .res Assessment: Plan:   Patient seen for 30 minutes and time spent counseling patient regarding possible treatment options.  Patient reports that he has not noticed any significant improvements since starting Trintellix other than some slight improvements.  He reports that Trintellix has been better tolerated than other medications that he has tried and that current side effect is tolerable and seems to be improving.  Will continue Trintellix 20 mg daily for another month to determine if he experiences any further improvement in signs and symptoms.  Discussed considering augmentation with Wellbutrin in the future once side effects have resolved since Wellbutrin could increase blood levels of Trintellix and possibly exacerbate side effects. Will increase Adderall XR to 25 mg daily since patient reports that Adderall XR no longer seems to be as effective as it once was for ADD signs and symptoms. Recommend continuing psychotherapy with Maggie Font, LC [facility]. Patient to follow-up with this provider in 4 weeks or sooner if clinically indicated. Patient advised to contact office with any questions, adverse effects,  or acute worsening in signs and symptoms.  [place]a was seen today for other, add and follow-up.  Diagnoses and all orders for this visit:  Attention deficit hyperactivity disorder (ADHD), combined type -     amphetamine-dextroamphetamine (ADDERALL XR) 25 MG 24 hr capsule; Take 1 capsule by mouth every morning.  Anxiety disorder, unspecified type -     vortioxetine HBr (TRINTELLIX) 20 MG TABS tablet; Take 1 tablet (20 mg total) by mouth daily.     Please see After Visit Summary for patient specific instructions.  Future Appointments  Date Time Provider Department Center  01/09/2020  2:00 PM Karie Kirks [facility] None  01/30/2020  9:00 AM Corie Chiquito, PMHNP [facility] None    No orders of the defined types were placed in this encounter.   -------------------------------

## 2020-01-09 ENCOUNTER — Ambulatory Visit (INDEPENDENT_AMBULATORY_CARE_PROVIDER_SITE_OTHER): Payer: 59 | Admitting: Psychology

## 2020-01-09 DIAGNOSIS — F4322 Adjustment disorder with anxiety: Secondary | ICD-10-CM

## 2020-01-23 ENCOUNTER — Ambulatory Visit (INDEPENDENT_AMBULATORY_CARE_PROVIDER_SITE_OTHER): Payer: 59 | Admitting: Psychology

## 2020-01-23 DIAGNOSIS — F4322 Adjustment disorder with anxiety: Secondary | ICD-10-CM

## 2020-01-30 ENCOUNTER — Encounter: Payer: Self-pay | Admitting: Psychiatry

## 2020-01-30 ENCOUNTER — Other Ambulatory Visit: Payer: Self-pay

## 2020-01-30 ENCOUNTER — Ambulatory Visit (INDEPENDENT_AMBULATORY_CARE_PROVIDER_SITE_OTHER): Payer: 59 | Admitting: Psychiatry

## 2020-01-30 VITALS — BP 124/63 | HR 70

## 2020-01-30 DIAGNOSIS — F329 Major depressive disorder, single episode, unspecified: Secondary | ICD-10-CM | POA: Diagnosis not present

## 2020-01-30 DIAGNOSIS — F902 Attention-deficit hyperactivity disorder, combined type: Secondary | ICD-10-CM | POA: Diagnosis not present

## 2020-01-30 DIAGNOSIS — F32A Depression, unspecified: Secondary | ICD-10-CM

## 2020-01-30 DIAGNOSIS — F419 Anxiety disorder, unspecified: Secondary | ICD-10-CM

## 2020-01-30 MED ORDER — VORTIOXETINE HBR 20 MG PO TABS: 20.0000 mg | ORAL_TABLET | Freq: Every day | ORAL | 1 refills | Status: DC

## 2020-01-30 MED ORDER — BUPROPION HCL ER (XL) 150 MG PO TB24: 150.0000 mg | ORAL_TABLET | Freq: Every day | ORAL | 1 refills | Status: DC

## 2020-01-30 MED ORDER — AMPHETAMINE-DEXTROAMPHET ER 25 MG PO CP24: 25.0000 mg | ORAL_CAPSULE | ORAL | 0 refills | Status: DC

## 2020-01-30 NOTE — Progress Notes (Signed)
Chris Vega [id number] 1991-07-10 29 y.o.  Subjective:   Patient ID:  Chris Vega is a 29 y.o. (DOB [birth year]) male.  Chief Complaint:  Chief Complaint  Patient presents with  . ADD  . Depression    HPI Chris Vega presents to the office today for follow-up of ADD, depression, and anxiety. He reports that nausea has improved and is no longer daily. He reports that he notices some occasional nausea and queasiness. He reports that he has not noticed a significant change with increase in Adderall XR. Suspects that he accidentally took Adderall XR twice and noticed increased jaw clinching, increased heart rate, and some improvement in concentration. He describes mood as "languishing" versus feeling sad. He reports that he is looking forward to the future. He reports that his energy is "fine." Motivation has been low. Some slight diminished interest in some things. He reports difficulty getting started on tasks. He reports that when he gets started he tends to get distracted. He reports "small amounts of anxiety." Had some anxiety on the ride home after visiting parents and coming home to an empty house. He reports continued social anxiety. He has had difficulty sleeping some nights due to heat, He reports sleeping adequately when temperature is comfortable. Appetite has been good. Denies SI.   Wife will remain in [place] through November. 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  He reports that he has a current routine pattern. Has a dog and 2 cats.   Past Psychiatric Medication Trials: Adderall XR- Caused insomnia in the past. Was prescribed 15 mg in the past. Recently took some that was remaining.  Adderall- Effective for about 2 years in college. Has not taken in about 3 years. May have taken 20 mg BID. Noticed some irritability towards the end of the day.  Ritalin- Took for 30 days. Noticed increased libido.  Xanax-  Has used prn for panic. Has not taken in over a year Klonopin- Was not as effective for panic Lexapro- Took for a couple of weeks and then stopped taking it. Recalls feeling emotionally dulled. Trintellix Buspar- Ineffective.  PHQ2-9     Office Visit from 08/25/2016 in Primary Care at [facility] Total Score  0       Review of Systems:  Review of Systems  Cardiovascular: Negative for palpitations.  Musculoskeletal: Negative for gait problem.  Neurological: Negative for tremors.  Psychiatric/Behavioral:       Please refer to HPI    Medications: I have reviewed the patient's current medications.  Current Outpatient Medications  Medication Sig Dispense Refill  . [START ON 02/03/2020] amphetamine-dextroamphetamine (ADDERALL XR) 25 MG 24 hr capsule Take 1 capsule by mouth every morning. 30 capsule 0  . buPROPion (WELLBUTRIN XL) 150 MG 24 hr tablet Take 1 tablet (150 mg total) by mouth daily. 30 tablet 1  . vortioxetine HBr (TRINTELLIX) 20 MG TABS tablet Take 1 tablet (20 mg total) by mouth daily. 30 tablet 1   No current facility-administered medications for this visit.    Medication Side Effects: Other: Occ nausea, queasiness. Dry mouth  Allergies: No Known Allergies  Past Medical History:  Diagnosis Date  . Allergic rhinitis     Family History  Problem Relation Age of Onset  . Lung cancer Maternal Grandfather   . Breast cancer Paternal Grandmother   . OCD Mother     Social History   Socioeconomic History  . Marital status: Married  Spouse name: Not on file  . Number of children: Not on file  . Years of education: Not on file  . Highest education level: Not on file  Occupational History  . Not on file  Tobacco Use  . Smoking status: Never Smoker  . Smokeless tobacco: Former Engineer, water and Sexual Activity  . Alcohol use: Yes    Comment: Reports infrequent ETOH use and will drink 3-4 drinks  . Drug use: Yes    Types: Marijuana  . Sexual activity: Not  on file  Other Topics Concern  . Not on file  Social History Narrative  . Not on file   Social Determinants of Health   Financial Resource Strain:   . Difficulty of Paying Living Expenses:   Food Insecurity:   . Worried About Programme researcher, broadcasting/film/video in the Last Year:   . Barista in the Last Year:   Transportation Needs:   . Freight forwarder (Medical):   Marland Kitchen Lack of Transportation (Non-Medical):   Physical Activity:   . Days of Exercise per Week:   . Minutes of Exercise per Session:   Stress:   . Feeling of Stress :   Social Connections:   . Frequency of Communication with Friends and Family:   . Frequency of Social Gatherings with Friends and Family:   . Attends Religious Services:   . Active Member of Clubs or Organizations:   . Attends Banker Meetings:   Marland Kitchen Marital Status:   Intimate Partner Violence:   . Fear of Current or Ex-Partner:   . Emotionally Abused:   Marland Kitchen Physically Abused:   . Sexually Abused:     Past Medical History, Surgical history, Social history, and Family history were reviewed and updated as appropriate.   Please see review of systems for further details on the patient's review from today.   Objective:   Physical Exam:  BP 124/63   Pulse 70   Physical Exam Constitutional:      General: He is not in acute distress. Musculoskeletal:        General: No deformity.  Neurological:     Mental Status: He is alert and oriented to person, place, and time.     Coordination: Coordination normal.  Psychiatric:        Attention and Perception: Attention and perception normal. He does not perceive auditory or visual hallucinations.        Mood and Affect: Mood is not anxious. Affect is not labile, blunt, angry or inappropriate.        Speech: Speech normal.        Behavior: Behavior normal.        Thought Content: Thought content normal. Thought content is not paranoid or delusional. Thought content does not include homicidal or  suicidal ideation. Thought content does not include homicidal or suicidal plan.        Cognition and Memory: Cognition and memory normal.        Judgment: Judgment normal.     Comments: Insight intact Mood is dysthymic     Lab Review:  No results found for: NA, K, CL, CO2, GLUCOSE, BUN, CREATININE, CALCIUM, PROT, ALBUMIN, AST, ALT, ALKPHOS, BILITOT, GFRNONAA, GFRAA  No results found for: WBC, RBC, HGB, HCT, PLT, MCV, MCH, MCHC, RDW, LYMPHSABS, MONOABS, EOSABS, BASOSABS  No results found for: POCLITH, LITHIUM   No results found for: PHENYTOIN, PHENOBARB, VALPROATE, CBMZ   .res Assessment: Plan:   Pt seen for  30 minutes and time spent discussing pt's difficulty with initiating certain tasks and discussing if this seems to be related more to low motivation with dysthymia versus difficulty initiating tasks with ADD. Discussed potential benefits, risks, and side effects of several tx approaches to include Wellbutrin XL, using a combination of XR and IR Adderall, or switching to Mydayis. Pt agrees to trial of Wellbutrin XL. Will start Wellbutrin XL to improve motivation and concentration.  He agrees to contiuation of trintellix 20 mg po qd at this time.  Continue Adderall XR 25 mg po qd for ADD.  Recommend continuing therapy with Dennison Bulla. [place]. Pt to f/u in 4 weeks or sooner if clinically indicated.  Patient advised to contact office with any questions, adverse effects, or acute worsening in signs and symptoms.  Reco was seen today for add and depression.  Diagnoses and all orders for this visit:  Depression, unspecified depression type -     buPROPion (WELLBUTRIN XL) 150 MG 24 hr tablet; Take 1 tablet (150 mg total) by mouth daily.  Anxiety disorder, unspecified type -     vortioxetine HBr (TRINTELLIX) 20 MG TABS tablet; Take 1 tablet (20 mg total) by mouth daily.  Attention deficit hyperactivity disorder (ADHD), combined type -     buPROPion (WELLBUTRIN XL) 150 MG 24 hr  tablet; Take 1 tablet (150 mg total) by mouth daily. -     amphetamine-dextroamphetamine (ADDERALL XR) 25 MG 24 hr capsule; Take 1 capsule by mouth every morning.     Please see After Visit Summary for patient specific instructions.  Future Appointments  Date Time Provider [place]  02/20/2020  2:00 PM Ivan Anchors [facility] None  03/05/2020  9:30 AM Thayer Headings, PMHNP CP-CP None  03/05/2020  2:00 PM Ivan Anchors, [facility] None  03/19/2020  2:00 PM Ivan Anchors, [facility] L[facility] None  04/02/2020  2:00 PM Ivan Anchors, [facility] None  04/16/2020  2:00 PM Ivan Anchors, [facility] None  04/30/2020  2:00 PM Ivan Anchors, [facility] None  05/14/2020  2:00 PM Ivan Anchors, [facility] None  05/28/2020  2:00 PM Ivan Anchors, [facility] None  06/11/2020  2:00 PM Ivan Anchors, [facility] L[facility] None  06/25/2020  2:00 PM Ivan Anchors, [facility] LBBH-[facility] None  07/09/2020  2:00 PM Ivan Anchors, [facility] L[facility]-BF None  07/23/2020  2:00 PM Cottle, Reed Breech, [facility] LBBH-BF None    No orders of the defined types were placed in this encounter.   -------------------------------

## 2020-02-09 ENCOUNTER — Ambulatory Visit (INDEPENDENT_AMBULATORY_CARE_PROVIDER_SITE_OTHER): Payer: 59 | Admitting: Psychology

## 2020-02-09 DIAGNOSIS — F4322 Adjustment disorder with anxiety: Secondary | ICD-10-CM

## 2020-02-13 ENCOUNTER — Ambulatory Visit (INDEPENDENT_AMBULATORY_CARE_PROVIDER_SITE_OTHER): Payer: 59 | Admitting: Psychology

## 2020-02-13 DIAGNOSIS — F4322 Adjustment disorder with anxiety: Secondary | ICD-10-CM | POA: Diagnosis not present

## 2020-02-20 ENCOUNTER — Ambulatory Visit (INDEPENDENT_AMBULATORY_CARE_PROVIDER_SITE_OTHER): Payer: 59 | Admitting: Psychology

## 2020-02-20 DIAGNOSIS — F4322 Adjustment disorder with anxiety: Secondary | ICD-10-CM | POA: Diagnosis not present

## 2020-02-21 ENCOUNTER — Other Ambulatory Visit: Payer: Self-pay | Admitting: Psychiatry

## 2020-02-21 DIAGNOSIS — F32A Depression, unspecified: Secondary | ICD-10-CM

## 2020-02-21 DIAGNOSIS — F902 Attention-deficit hyperactivity disorder, combined type: Secondary | ICD-10-CM

## 2020-02-22 ENCOUNTER — Ambulatory Visit: Payer: 59 | Admitting: Psychiatry

## 2020-02-27 ENCOUNTER — Ambulatory Visit (INDEPENDENT_AMBULATORY_CARE_PROVIDER_SITE_OTHER): Payer: 59 | Admitting: Psychology

## 2020-02-27 DIAGNOSIS — F4322 Adjustment disorder with anxiety: Secondary | ICD-10-CM | POA: Diagnosis not present

## 2020-03-04 ENCOUNTER — Ambulatory Visit (INDEPENDENT_AMBULATORY_CARE_PROVIDER_SITE_OTHER): Payer: 59 | Admitting: Psychology

## 2020-03-04 DIAGNOSIS — F4322 Adjustment disorder with anxiety: Secondary | ICD-10-CM | POA: Diagnosis not present

## 2020-03-05 ENCOUNTER — Ambulatory Visit: Payer: 59 | Admitting: Psychiatry

## 2020-03-05 ENCOUNTER — Ambulatory Visit: Payer: 59 | Admitting: Psychology

## 2020-03-07 ENCOUNTER — Ambulatory Visit (INDEPENDENT_AMBULATORY_CARE_PROVIDER_SITE_OTHER): Payer: 59 | Admitting: Psychology

## 2020-03-07 DIAGNOSIS — F4323 Adjustment disorder with mixed anxiety and depressed mood: Secondary | ICD-10-CM

## 2020-03-12 ENCOUNTER — Ambulatory Visit (INDEPENDENT_AMBULATORY_CARE_PROVIDER_SITE_OTHER): Payer: 59 | Admitting: Psychology

## 2020-03-12 DIAGNOSIS — F4322 Adjustment disorder with anxiety: Secondary | ICD-10-CM

## 2020-03-15 ENCOUNTER — Encounter: Payer: Self-pay | Admitting: Psychiatry

## 2020-03-15 ENCOUNTER — Ambulatory Visit (INDEPENDENT_AMBULATORY_CARE_PROVIDER_SITE_OTHER): Payer: 59 | Admitting: Psychiatry

## 2020-03-15 ENCOUNTER — Other Ambulatory Visit: Payer: Self-pay

## 2020-03-15 DIAGNOSIS — F902 Attention-deficit hyperactivity disorder, combined type: Secondary | ICD-10-CM

## 2020-03-15 DIAGNOSIS — F419 Anxiety disorder, unspecified: Secondary | ICD-10-CM

## 2020-03-15 MED ORDER — VORTIOXETINE HBR 20 MG PO TABS: 20.0000 mg | ORAL_TABLET | Freq: Every day | ORAL | 0 refills | Status: DC

## 2020-03-15 MED ORDER — AMPHETAMINE-DEXTROAMPHET ER 25 MG PO CP24: 25.0000 mg | ORAL_CAPSULE | ORAL | 0 refills | Status: DC

## 2020-03-15 NOTE — Progress Notes (Signed)
Fransico Sciandra [place] [id number] 1991-03-26 29 y.o.  Subjective:   Patient ID:  Chris Vega is a 29 y.o. (DOB 1991/04/19) male.  Chief Complaint:  Chief Complaint  Patient presents with  . Depression  . ADD    HPI Nakia Koble Serio presents to the office today for follow-up of depression and ADD. He reports that his wife that he has been with for 8 years informed him on 02/04/20 that she would like to separate. Has had increased depression in response to separation. Denies panic attacks. Reports that he had intrusive thoughts of suicide when he initially learned about separation. Did not experience intent or plan. He stopped doing gardening for 2-3 weeks and has started back to gardening in the last week. Reports for several weeks he mainly sat and watched TV. Reports some friends have taken him out to lunch. He reports that he no longer has a confidant. He reports that he tends to be guarded. Energy and motivation have been lower since learning about separation and have improved slightly and remain low. He reports that he is sleeping ok overall. Sleeping 3-5 hours most nights and taking naps during the day. Appetite has been low. He reports that he lost about 10 lbs. Eating once a day. He reports that he has not focused on things to be able to assess concentration. Denies any SI in the last 3 weeks.   Has been seeing therapist weekly. Visited parents recently.   Past Psychiatric Medication Trials: Adderall XR- Caused insomnia in the past. Was prescribed 15 mg in the past. Recently took some that was remaining.  Adderall- Effective for about 2 years in college. Has not taken in about 3 years. May have taken 20 mg BID. Noticed some irritability towards the end of the day. Ritalin- Took for 30 days. Noticed increased libido. Xanax- Has used prn for panic. Has not taken in over a year Klonopin- Was not as effective for panic Lexapro- Took for a couple of weeks and then stopped taking  it. Recalls feeling emotionally dulled. Trintellix Buspar- Ineffective.  PHQ2-9     Office Visit from 08/25/2016 in Primary Care at [facility] Total Score 0       Review of Systems:  Review of Systems  HENT: Positive for sinus pain.   Musculoskeletal: Negative for gait problem.  Neurological: Negative for tremors and headaches.  Psychiatric/Behavioral:       Please refer to HPI    Medications: I have reviewed the patient's current medications.  Current Outpatient Medications  Medication Sig Dispense Refill  . [START ON 04/10/2020] amphetamine-dextroamphetamine (ADDERALL XR) 25 MG 24 hr capsule Take 1 capsule by mouth every morning. 30 capsule 0  . buPROPion (WELLBUTRIN XL) 150 MG 24 hr tablet TAKE 1 TABLET BY MOUTH EVERY DAY 90 tablet 0  . vortioxetine HBr (TRINTELLIX) 20 MG TABS tablet Take 1 tablet (20 mg total) by mouth daily. 90 tablet 0   No current facility-administered medications for this visit.    Medication Side Effects: None  Allergies: No Known Allergies  Past Medical History:  Diagnosis Date  . Allergic rhinitis     Family History  Problem Relation Age of Onset  . Lung cancer Maternal Grandfather   . Breast cancer Paternal Grandmother   . OCD Mother     Social History   Socioeconomic History  . Marital status: Married    Spouse name: Not on file  . Number of children: Not on file  . Years  of education: Not on file  . Highest education level: Not on file  Occupational History  . Not on file  Tobacco Use  . Smoking status: Never Smoker  . Smokeless tobacco: Former Engineer, water and Sexual Activity  . Alcohol use: Yes    Comment: Reports infrequent ETOH use and will drink 3-4 drinks  . Drug use: Yes    Types: Marijuana  . Sexual activity: Not on file  Other Topics Concern  . Not on file  Social History Narrative  . Not on file   Social Determinants of Health   Financial Resource Strain:   . Difficulty of Paying Living Expenses:    Food Insecurity:   . Worried About Programme researcher, broadcasting/film/video in the Last Year:   . Barista in the Last Year:   Transportation Needs:   . Freight forwarder (Medical):   Marland Kitchen Lack of Transportation (Non-Medical):   Physical Activity:   . Days of Exercise per Week:   . Minutes of Exercise per Session:   Stress:   . Feeling of Stress :   Social Connections:   . Frequency of Communication with Friends and Family:   . Frequency of Social Gatherings with Friends and Family:   . Attends Religious Services:   . Active Member of Clubs or Organizations:   . Attends Banker Meetings:   Marland Kitchen Marital Status:   Intimate Partner Violence:   . Fear of Current or Ex-Partner:   . Emotionally Abused:   Marland Kitchen Physically Abused:   . Sexually Abused:     Past Medical History, Surgical history, Social history, and Family history were reviewed and updated as appropriate.   Please see review of systems for further details on the patient's review from today.   Objective:   Physical Exam:  BP 108/66   Pulse 69   Physical Exam Constitutional:      General: He is not in acute distress. Musculoskeletal:        General: No deformity.  Neurological:     Mental Status: He is alert and oriented to person, place, and time.     Coordination: Coordination normal.  Psychiatric:        Attention and Perception: Attention and perception normal. He does not perceive auditory or visual hallucinations.        Mood and Affect: Mood is anxious and depressed. Affect is not labile, blunt, angry or inappropriate.        Speech: Speech normal.        Behavior: Behavior normal.        Thought Content: Thought content normal. Thought content is not paranoid or delusional. Thought content does not include homicidal or suicidal ideation. Thought content does not include homicidal or suicidal plan.        Cognition and Memory: Cognition and memory normal.        Judgment: Judgment normal.     Comments:  Insight intact     Lab Review:  No results found for: NA, K, CL, CO2, GLUCOSE, BUN, CREATININE, CALCIUM, PROT, ALBUMIN, AST, ALT, ALKPHOS, BILITOT, GFRNONAA, GFRAA  No results found for: WBC, RBC, HGB, HCT, PLT, MCV, MCH, MCHC, RDW, LYMPHSABS, MONOABS, EOSABS, BASOSABS  No results found for: POCLITH, LITHIUM   No results found for: PHENYTOIN, PHENOBARB, VALPROATE, CBMZ   .res Assessment: Plan:   Discussed possible treatment options.  Patient reports that he would like to continue current plan of care since worsening depression has  been in response to acute psychosocial stress and he reports that it is therefore difficult to determine response to initiation of Wellbutrin XL.  He reports that he would like to continue current dose of Wellbutrin XL for another month and then reassess if increase in dose may be indicated. Continue Wellbutrin XL 150 mg daily for depression. Continue Trintellix 20 mg daily for depression and to improve cognitive processing speed. Will resume Adderall XR 25 mg daily months current prescription for Adderall XR 20 mg is completed. Patient to follow-up in 4 weeks or sooner if clinically indicated. Recommend continuing psychotherapy with Maggie Font, [facility]. Patient advised to contact office with any questions, adverse effects, or acute worsening in signs and symptoms.  Therin was seen today for depression and add.  Diagnoses and all orders for this visit:  Attention deficit hyperactivity disorder (ADHD), combined type -     amphetamine-dextroamphetamine (ADDERALL XR) 25 MG 24 hr capsule; Take 1 capsule by mouth every morning.  Anxiety disorder, unspecified type -     vortioxetine HBr (TRINTELLIX) 20 MG TABS tablet; Take 1 tablet (20 mg total) by mouth daily.     Please see After Visit Summary for patient specific instructions.  Future Appointments  Date Time Provider Department Center  03/26/2020  2:00 PM Karie Kirks [facility] None  04/02/2020   2:00 PM Karie Kirks [facility] None  04/09/2020  2:00 PM Karie Kirks, [facility] None  04/11/2020  9:00 AM Corie Chiquito, PMHNP CP-CP None  04/16/2020  2:00 PM Karie Kirks, [facility] None  04/23/2020  2:00 PM Karie Kirks, [facility] None  04/30/2020  2:00 PM Karie Kirks, [facility] L[facility] None  05/14/2020  2:00 PM Karie Kirks, [facility] None  05/28/2020  2:00 PM Karie Kirks, [facility] L[facility] None  06/11/2020  2:00 PM Karie Kirks, [facility] None  06/25/2020  2:00 PM Karie Kirks, [facility] L[facility] None  07/09/2020  2:00 PM Karie Kirks, [facility] L[facility]-BF None  07/23/2020  2:00 PM Cottle, Sheppard Plumber, [facility] LBB[facility] None    No orders of the defined types were placed in this encounter.   -------------------------------

## 2020-03-19 ENCOUNTER — Ambulatory Visit: Payer: 59 | Admitting: Psychology

## 2020-03-19 ENCOUNTER — Ambulatory Visit (INDEPENDENT_AMBULATORY_CARE_PROVIDER_SITE_OTHER): Payer: 59 | Admitting: Psychology

## 2020-03-19 DIAGNOSIS — F4322 Adjustment disorder with anxiety: Secondary | ICD-10-CM | POA: Diagnosis not present

## 2020-03-26 ENCOUNTER — Ambulatory Visit (INDEPENDENT_AMBULATORY_CARE_PROVIDER_SITE_OTHER): Payer: 59 | Admitting: Psychology

## 2020-03-26 DIAGNOSIS — F4322 Adjustment disorder with anxiety: Secondary | ICD-10-CM

## 2020-04-02 ENCOUNTER — Ambulatory Visit: Payer: 59 | Admitting: Psychology

## 2020-04-03 ENCOUNTER — Ambulatory Visit (INDEPENDENT_AMBULATORY_CARE_PROVIDER_SITE_OTHER): Payer: 59 | Admitting: Psychology

## 2020-04-03 DIAGNOSIS — F4322 Adjustment disorder with anxiety: Secondary | ICD-10-CM | POA: Diagnosis not present

## 2020-04-09 ENCOUNTER — Ambulatory Visit: Payer: 59 | Admitting: Psychology

## 2020-04-11 ENCOUNTER — Encounter: Payer: Self-pay | Admitting: Psychiatry

## 2020-04-11 ENCOUNTER — Ambulatory Visit (INDEPENDENT_AMBULATORY_CARE_PROVIDER_SITE_OTHER): Payer: 59 | Admitting: Psychology

## 2020-04-11 ENCOUNTER — Telehealth: Payer: Self-pay | Admitting: Psychiatry

## 2020-04-11 ENCOUNTER — Telehealth (INDEPENDENT_AMBULATORY_CARE_PROVIDER_SITE_OTHER): Payer: 59 | Admitting: Psychiatry

## 2020-04-11 DIAGNOSIS — F902 Attention-deficit hyperactivity disorder, combined type: Secondary | ICD-10-CM | POA: Diagnosis not present

## 2020-04-11 DIAGNOSIS — F4322 Adjustment disorder with anxiety: Secondary | ICD-10-CM | POA: Diagnosis not present

## 2020-04-11 DIAGNOSIS — F329 Major depressive disorder, single episode, unspecified: Secondary | ICD-10-CM

## 2020-04-11 DIAGNOSIS — F419 Anxiety disorder, unspecified: Secondary | ICD-10-CM

## 2020-04-11 DIAGNOSIS — F32A Depression, unspecified: Secondary | ICD-10-CM

## 2020-04-11 MED ORDER — FLUOXETINE HCL 10 MG PO CAPS: 10.0000 mg | ORAL_CAPSULE | Freq: Every day | ORAL | 1 refills | Status: DC

## 2020-04-11 MED ORDER — BUPROPION HCL ER (XL) 150 MG PO TB24: 150.0000 mg | ORAL_TABLET | Freq: Every day | ORAL | 0 refills | Status: DC

## 2020-04-11 NOTE — Telephone Encounter (Signed)
Mr. braeden, kennan are scheduled for a virtual visit with your provider today.    Just as we do with appointments in the office, we must obtain your consent to participate.  Your consent will be active for this visit and any virtual visit you may have with one of our providers in the next 365 days.    If you have a MyChart account, I can also send a copy of this consent to you electronically.  All virtual visits are billed to your insurance company just like a traditional visit in the office.  As this is a virtual visit, video technology does not allow for your provider to perform a traditional examination.  This may limit your provider's ability to fully assess your condition.  If your provider identifies any concerns that need to be evaluated in person or the need to arrange testing such as labs, EKG, etc, we will make arrangements to do so.    Although advances in technology are sophisticated, we cannot ensure that it will always work on either your end or our end.  If the connection with a video visit is poor, we may have to switch to a telephone visit.  With either a video or telephone visit, we are not always able to ensure that we have a secure connection.   I need to obtain your verbal consent now.   Are you willing to proceed with your visit today?   Slate Debroux Michelotti has provided verbal consent on 04/11/2020 for a virtual visit (video or telephone).   Corie Chiquito, PMHNP 04/11/2020  9:01 AM

## 2020-04-11 NOTE — Progress Notes (Signed)
Chris Vega [id number] Oct 21, 1990 29 y.o.  Virtual Visit via Video Note  I connected with pt @ on 04/11/20 at  9:00 AM EDT by a video enabled telemedicine application and verified that I am speaking with the correct person using two identifiers.   I discussed the limitations of evaluation and management by telemedicine and the availability of in person appointments. The patient expressed understanding and agreed to proceed.  I discussed the assessment and treatment plan with the patient. The patient was provided an opportunity to ask questions and all were answered. The patient agreed with the plan and demonstrated an understanding of the instructions.   The patient was advised to call back or seek an in-person evaluation if the symptoms worsen or if the condition fails to improve as anticipated.  I provided 30 minutes of non-face-to-face time during this encounter.  The patient was located at home.  The provider was located at [facility].   Corie Chiquito, PMHNP   Subjective:   Patient ID:  Chris Vega is a 29 y.o. (DOB 03-25-1991) male.  Chief Complaint:  Chief Complaint  Patient presents with  . Follow-up    Depression, Anxiety, ADD    HPI Chris Vega presents for follow-up of depression, anxiety, and ADD. He reports that he is "doing a little better" in terms of depression. He reports that depression is not as persistent and now sadness is mostly in response to marital stressors. He reports that he is feeling more hopeful about relationship. 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 He reports that worry and anxious thoughts have been improved. Sleep has been normal. Appetite has been good. Energy and motivation have improved. He reports that concentration is "decent" and was able to search for jobs for about 8-10 hours. Denies SI.   He reports that he has been inconsistent  with taking Trintellix due to missing refills. Had some nausea with re-starting medication. He reports that he has had consistent nausea and has had to try to take Trintellix with food.   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 Has not been gardening recently. Has been looking for work.   Continues to see his therapist regularly.   Past Psychiatric Medication Trials: Adderall XR- Caused insomnia in the past. Was prescribed 15 mg in the past. Recently took some that was remaining.  Adderall- Effective for about 2 years in college. Has not taken in about 3 years. May have taken 20 mg BID. Noticed some irritability towards the end of the day. Ritalin- Took for 30 days. Noticed increased libido. Xanax- Has used prn for panic. Has not taken in over a year Klonopin- Was not as effective for panic Lexapro- Took for a couple of weeks and then stopped taking it. Recalls feeling emotionally dulled. Trintellix Wellbutrin XL Buspar- Ineffective.   Review of Systems:  Review of Systems  Cardiovascular: Negative for palpitations.  Musculoskeletal: Negative for gait problem.  Neurological: Negative for tremors.  Psychiatric/Behavioral:       Please refer to HPI    Medications: I have reviewed the patient's current medications.  Current Outpatient Medications  Medication Sig Dispense Refill  . buPROPion (WELLBUTRIN XL) 150 MG 24 hr tablet Take 1 tablet (150 mg total) by mouth daily. 90 tablet 0  . amphetamine-dextroamphetamine (ADDERALL XR) 25 MG 24 hr capsule Take 1 capsule by mouth every morning. 30 capsule 0  . FLUoxetine (PROZAC) 10 MG capsule  Take 1 capsule (10 mg total) by mouth daily. 30 capsule 1   No current facility-administered medications for this visit.    Medication Side Effects: Nausea  Allergies: No Known Allergies  Past Medical History:  Diagnosis Date  . Allergic rhinitis     Family History  Problem Relation Age of Onset  . Lung cancer Maternal  Grandfather   . Breast cancer Paternal Grandmother   . OCD Mother     Social History   Socioeconomic History  . Marital status: Married    Spouse name: Not on file  . Number of children: Not on file  . Years of education: Not on file  . Highest education level: Not on file  Occupational History  . Not on file  Tobacco Use  . Smoking status: Never Smoker  . Smokeless tobacco: Former Engineer, water and Sexual Activity  . Alcohol use: Yes    Comment: Reports infrequent ETOH use and will drink 3-4 drinks  . Drug use: Yes    Types: Marijuana  . Sexual activity: Not on file  Other Topics Concern  . Not on file  Social History Narrative  . Not on file   Social Determinants of Health   Financial Resource Strain:   . Difficulty of Paying Living Expenses: Not on file  Food Insecurity:   . Worried About Programme researcher, broadcasting/film/video in the Last Year: Not on file  . Ran Out of Food in the Last Year: Not on file  Transportation Needs:   . Lack of Transportation (Medical): Not on file  . Lack of Transportation (Non-Medical): Not on file  Physical Activity:   . Days of Exercise per Week: Not on file  . Minutes of Exercise per Session: Not on file  Stress:   . Feeling of Stress : Not on file  Social Connections:   . Frequency of Communication with Friends and Family: Not on file  . Frequency of Social Gatherings with Friends and Family: Not on file  . Attends Religious Services: Not on file  . Active Member of Clubs or Organizations: Not on file  . Attends Banker Meetings: Not on file  . Marital Status: Not on file  Intimate Partner Violence:   . Fear of Current or Ex-Partner: Not on file  . Emotionally Abused: Not on file  . Physically Abused: Not on file  . Sexually Abused: Not on file    Past Medical History, Surgical history, Social history, and Family history were reviewed and updated as appropriate.   Please see review of systems for further details on the  patient's review from today.   Objective:   Physical Exam:  There were no vitals taken for this visit.  Physical Exam Neurological:     Mental Status: He is alert and oriented to person, place, and time.     Cranial Nerves: No dysarthria.  Psychiatric:        Attention and Perception: Attention and perception normal.        Speech: Speech normal.        Behavior: Behavior is cooperative.        Thought Content: Thought content normal. Thought content is not paranoid or delusional. Thought content does not include homicidal or suicidal ideation. Thought content does not include homicidal or suicidal plan.        Cognition and Memory: Cognition and memory normal.        Judgment: Judgment normal.     Comments: Insight  intact Mood is appropriate to content. Affect is congruent     Lab Review:  No results found for: NA, K, CL, CO2, GLUCOSE, BUN, CREATININE, CALCIUM, PROT, ALBUMIN, AST, ALT, ALKPHOS, BILITOT, GFRNONAA, GFRAA  No results found for: WBC, RBC, HGB, HCT, PLT, MCV, MCH, MCHC, RDW, LYMPHSABS, MONOABS, EOSABS, BASOSABS  No results found for: POCLITH, LITHIUM   No results found for: PHENYTOIN, PHENOBARB, VALPROATE, CBMZ   .res Assessment: Plan:   Will discontinue Trintellix due to GI side effects. Discussed potential benefits, risks, and side effects of Prozac.  Discussed that Prozac has a longer half-life and may therefore be more effective if unable to take consistently.  Patient agrees to trial of Prozac.  Will start Prozac 10 mg daily for social anxiety and depression. Continue Wellbutrin XL 150 mg daily for depression. Continue Adderall XR.  Patient has been taking Adderall XR 20 mg daily and plans to pick up prescription for Adderall XR 25 mg daily. Recommend continuing psychotherapy. Patient to follow-up in 4 weeks or sooner if clinically indicated. Patient advised to contact office with any questions, adverse effects, or acute worsening in signs and  symptoms.  Carlitos was seen today for follow-up.  Diagnoses and all orders for this visit:  Anxiety disorder, unspecified type -     FLUoxetine (PROZAC) 10 MG capsule; Take 1 capsule (10 mg total) by mouth daily.  Attention deficit hyperactivity disorder (ADHD), combined type -     buPROPion (WELLBUTRIN XL) 150 MG 24 hr tablet; Take 1 tablet (150 mg total) by mouth daily.  Depression, unspecified depression type -     FLUoxetine (PROZAC) 10 MG capsule; Take 1 capsule (10 mg total) by mouth daily. -     buPROPion (WELLBUTRIN XL) 150 MG 24 hr tablet; Take 1 tablet (150 mg total) by mouth daily.     Please see After Visit Summary for patient specific instructions.  Future Appointments  Date Time Provider Department Center  04/16/2020  2:00 PM Karie Kirks [facility] None  04/23/2020  2:00 PM Karie Kirks [facility] None  04/30/2020  2:00 PM Karie Kirks, [facility] None  05/14/2020  2:00 PM Karie Kirks, [facility] None  05/28/2020  2:00 PM Karie Kirks, [facility] None  06/11/2020  2:00 PM Karie Kirks, [facility] None  06/25/2020  2:00 PM Karie Kirks, [facility] None  07/09/2020  2:00 PM Karie Kirks, [facility] None  07/23/2020  2:00 PM Cottle, Sheppard Plumber, [facility] None    No orders of the defined types were placed in this encounter.     -------------------------------

## 2020-04-16 ENCOUNTER — Ambulatory Visit (INDEPENDENT_AMBULATORY_CARE_PROVIDER_SITE_OTHER): Payer: 59 | Admitting: Psychology

## 2020-04-16 DIAGNOSIS — F4322 Adjustment disorder with anxiety: Secondary | ICD-10-CM

## 2020-04-23 ENCOUNTER — Ambulatory Visit (INDEPENDENT_AMBULATORY_CARE_PROVIDER_SITE_OTHER): Payer: 59 | Admitting: Psychology

## 2020-04-23 DIAGNOSIS — F4322 Adjustment disorder with anxiety: Secondary | ICD-10-CM

## 2020-04-30 ENCOUNTER — Ambulatory Visit (INDEPENDENT_AMBULATORY_CARE_PROVIDER_SITE_OTHER): Payer: 59 | Admitting: Psychology

## 2020-04-30 DIAGNOSIS — F4322 Adjustment disorder with anxiety: Secondary | ICD-10-CM | POA: Diagnosis not present

## 2020-05-07 ENCOUNTER — Ambulatory Visit: Payer: 59 | Admitting: Psychology

## 2020-05-11 ENCOUNTER — Other Ambulatory Visit: Payer: Self-pay | Admitting: Psychiatry

## 2020-05-11 DIAGNOSIS — F419 Anxiety disorder, unspecified: Secondary | ICD-10-CM

## 2020-05-11 DIAGNOSIS — F32A Depression, unspecified: Secondary | ICD-10-CM

## 2020-05-13 NOTE — Telephone Encounter (Signed)
review 

## 2020-05-14 ENCOUNTER — Ambulatory Visit: Payer: 59 | Admitting: Psychology

## 2020-05-21 ENCOUNTER — Ambulatory Visit (INDEPENDENT_AMBULATORY_CARE_PROVIDER_SITE_OTHER): Payer: 59 | Admitting: Psychology

## 2020-05-21 DIAGNOSIS — F4322 Adjustment disorder with anxiety: Secondary | ICD-10-CM | POA: Diagnosis not present

## 2020-05-28 ENCOUNTER — Telehealth (INDEPENDENT_AMBULATORY_CARE_PROVIDER_SITE_OTHER): Payer: 59 | Admitting: Psychiatry

## 2020-05-28 ENCOUNTER — Ambulatory Visit (INDEPENDENT_AMBULATORY_CARE_PROVIDER_SITE_OTHER): Payer: 59 | Admitting: Psychology

## 2020-05-28 ENCOUNTER — Encounter: Payer: Self-pay | Admitting: Psychiatry

## 2020-05-28 DIAGNOSIS — F902 Attention-deficit hyperactivity disorder, combined type: Secondary | ICD-10-CM

## 2020-05-28 DIAGNOSIS — F4322 Adjustment disorder with anxiety: Secondary | ICD-10-CM

## 2020-05-28 DIAGNOSIS — F32A Depression, unspecified: Secondary | ICD-10-CM | POA: Diagnosis not present

## 2020-05-28 MED ORDER — AMPHETAMINE-DEXTROAMPHET ER 25 MG PO CP24: 25.0000 mg | ORAL_CAPSULE | ORAL | 0 refills | Status: DC

## 2020-05-28 MED ORDER — BUPROPION HCL ER (XL) 150 MG PO TB24: 150.0000 mg | ORAL_TABLET | Freq: Every day | ORAL | 0 refills | Status: DC

## 2020-05-28 NOTE — Progress Notes (Signed)
Chris Vega [place] [id number] [birth year] 29 y.o.  Virtual Visit via Video Note  I connected with pt @ on 05/28/20 at 10:00 AM EDT by a video enabled telemedicine application and verified that I am speaking with the correct person using two identifiers.   I discussed the limitations of evaluation and management by telemedicine and the availability of in person appointments. The patient expressed understanding and agreed to proceed.  I discussed the assessment and treatment plan with the patient. The patient was provided an opportunity to ask questions and all were answered. The patient agreed with the plan and demonstrated an understanding of the instructions.   The patient was advised to call back or seek an in-person evaluation if the symptoms worsen or if the condition fails to improve as anticipated.  I provided 30 minutes of non-face-to-face time during this encounter.  The patient was located at home.  The provider was located at [facility].   Corie Chiquito, PMHNP   Subjective:   Patient ID:  Chris Vega is a 29 y.o. (DOB 1991/02/04) male.  Chief Complaint:  Chief Complaint  Patient presents with  . Follow-up    ADD, anxiety, and depression    HPI Chris Vega presents for follow-up of depression, anxiety, and ADD. Describes mood as "neutral" and denies persistent sad mood and instead has "sad moments" when he thinks about marital stressors/separation. He reports that he no longer feels that he has to constantly distract himself. He reports that his anxiety is "mostly ok." He reports that he is putting himself in some social situations that he would not normally do.  He reports that sleep has improved since starting to exercise and now sometimes sleeps more than 7 hours a night. Appetite has been "normal." Weight has been stable. Concentration has been ok. Denies SI.   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 He reports that he has been socializing some. Has not been seeing family often. Continues to see therapist regularly. Plans to take a class in January.   Past Psychiatric Medication Trials: Adderall XR- Caused insomnia in the past. Was prescribed 15 mg in the past. Recently took some that was remaining.  Adderall- Effective for about 2 years in college. Has not taken in about 3 years. May have taken 20 mg BID. Noticed some irritability towards the end of the day. Ritalin- Took for 30 days. Noticed increased libido. Xanax- Has used prn for panic. Has not taken in over a year Klonopin- Was not as effective for panic Lexapro- Took for a couple of weeks and then stopped taking it. Recalls feeling emotionally dulled. Prozac Trintellix Wellbutrin XL Buspar- Ineffective.  Review of Systems:  Review of Systems  Cardiovascular: Negative for palpitations.  Musculoskeletal: Negative for gait problem.  Neurological: Negative for tremors.  Psychiatric/Behavioral:       Please refer to HPI    Medications: I have reviewed the patient's current medications.  Current Outpatient Medications  Medication Sig Dispense Refill  . amphetamine-dextroamphetamine (ADDERALL XR) 25 MG 24 hr capsule Take 1 capsule by mouth every morning. 30 capsule 0  . B Complex Vitamins (B COMPLEX PO) Take by mouth.    Marland Kitchen buPROPion (WELLBUTRIN XL) 150 MG 24 hr tablet Take 1 tablet (150 mg total) by mouth daily. 90 tablet 0  . FLUoxetine (PROZAC) 10 MG capsule TAKE 1 CAPSULE BY MOUTH EVERY  DAY 90 capsule 1  . Multiple Vitamin (MULTIVITAMIN) tablet Take 1 tablet by mouth daily.    [name] ON 06/25/2020] amphetamine-dextroamphetamine (ADDERALL XR) 25 MG 24 hr capsule Take 1 capsule by mouth every morning. 30 capsule 0   No current facility-administered medications for this visit.    Medication Side Effects: None  Allergies: No Known Allergies  Past Medical  History:  Diagnosis Date  . Allergic rhinitis     Family History  Problem Relation Age of Onset  . Lung cancer Maternal Grandfather   . Breast cancer Paternal Grandmother   . OCD Mother     Social History   Socioeconomic History  . Marital status: Married    Spouse name: Not on file  . Number of children: Not on file  . Years of education: Not on file  . Highest education level: Not on file  Occupational History  . Not on file  Tobacco Use  . Smoking status: Never Smoker  . Smokeless tobacco: Former Engineer, water and Sexual Activity  . Alcohol use: Yes    Comment: Reports infrequent ETOH use and will drink 3-4 drinks  . Drug use: Yes    Types: Marijuana  . Sexual activity: Not on file  Other Topics Concern  . Not on file  Social History Narrative  . Not on file   Social Determinants of Health   Financial Resource Strain:   . Difficulty of Paying Living Expenses: Not on file  Food Insecurity:   . Worried About Programme researcher, broadcasting/film/video in the Last Year: Not on file  . Ran Out of Food in the Last Year: Not on file  Transportation Needs:   . Lack of Transportation (Medical): Not on file  . Lack of Transportation (Non-Medical): Not on file  Physical Activity:   . Days of Exercise per Week: Not on file  . Minutes of Exercise per Session: Not on file  Stress:   . Feeling of Stress : Not on file  Social Connections:   . Frequency of Communication with Friends and Family: Not on file  . Frequency of Social Gatherings with Friends and Family: Not on file  . Attends Religious Services: Not on file  . Active Member of Clubs or Organizations: Not on file  . Attends Banker Meetings: Not on file  . Marital Status: Not on file  Intimate Partner Violence:   . Fear of Current or Ex-Partner: Not on file  . Emotionally Abused: Not on file  . Physically Abused: Not on file  . Sexually Abused: Not on file    Past Medical History, Surgical history, Social history,  and Family history were reviewed and updated as appropriate.   Please see review of systems for further details on the patient's review from today.   Objective:   Physical Exam:  Pulse 84   Wt 152 lb (68.9 kg)   BMI 23.81 kg/m   Physical Exam Neurological:     Mental Status: He is alert and oriented to person, place, and time.     Cranial Nerves: No dysarthria.  Psychiatric:        Attention and Perception: Attention and perception normal.        Speech: Speech normal.        Behavior: Behavior is cooperative.        Thought Content: Thought content normal. Thought content is not paranoid or delusional. Thought content does not include homicidal or suicidal ideation. Thought content does  not include homicidal or suicidal plan.        Cognition and Memory: Cognition and memory normal.        Judgment: Judgment normal.     Comments: Insight intact Mood presents as less depressed and less anxious     Lab Review:  No results found for: NA, K, CL, CO2, GLUCOSE, BUN, CREATININE, CALCIUM, PROT, ALBUMIN, AST, ALT, ALKPHOS, BILITOT, GFRNONAA, GFRAA  No results found for: WBC, RBC, HGB, HCT, PLT, MCV, MCH, MCHC, RDW, LYMPHSABS, MONOABS, EOSABS, BASOSABS  No results found for: POCLITH, LITHIUM   No results found for: PHENYTOIN, PHENOBARB, VALPROATE, CBMZ   .res Assessment: Plan:   Will continue current plan of care since pt reports improved mood and anxiety signs and symptoms since starting Prozac. Will continue Prozac 10 mg po qd for depression and anxiety. Continue Wellbutrin XL 150 mg po qd for depression. Continue Adderall XR 25 mg po q am for ADD.  Pt to follow-up in 2 months or sooner if clinically indicated. Patient advised to contact office with any questions, adverse effects, or acute worsening in signs and symptoms.  Jaylynn was seen today for follow-up.  Diagnoses and all orders for this visit:  Attention deficit hyperactivity disorder (ADHD), combined type -      buPROPion (WELLBUTRIN XL) 150 MG 24 hr tablet; Take 1 tablet (150 mg total) by mouth daily. -     amphetamine-dextroamphetamine (ADDERALL XR) 25 MG 24 hr capsule; Take 1 capsule by mouth every morning. -     amphetamine-dextroamphetamine (ADDERALL XR) 25 MG 24 hr capsule; Take 1 capsule by mouth every morning.  Depression, unspecified depression type -     buPROPion (WELLBUTRIN XL) 150 MG 24 hr tablet; Take 1 tablet (150 mg total) by mouth daily.     Please see After Visit Summary for patient specific instructions.  Future Appointments  Date Time Provider Department Center  06/04/2020  2:00 PM Karie Kirks [facility] None  06/18/2020  2:00 PM Karie Kirks [facility] None  06/25/2020  2:00 PM Karie Kirks [facility] None  07/09/2020  2:00 PM Karie Kirks, [facility] None  07/23/2020  2:00 PM Cottle, Sheppard Plumber, [facility] None    No orders of the defined types were placed in this encounter.     -------------------------------

## 2020-06-04 ENCOUNTER — Ambulatory Visit (INDEPENDENT_AMBULATORY_CARE_PROVIDER_SITE_OTHER): Payer: 59 | Admitting: Psychology

## 2020-06-04 DIAGNOSIS — F4322 Adjustment disorder with anxiety: Secondary | ICD-10-CM | POA: Diagnosis not present

## 2020-06-11 ENCOUNTER — Ambulatory Visit: Payer: 59 | Admitting: Psychology

## 2020-06-18 ENCOUNTER — Ambulatory Visit (INDEPENDENT_AMBULATORY_CARE_PROVIDER_SITE_OTHER): Payer: 59 | Admitting: Psychology

## 2020-06-18 DIAGNOSIS — F4322 Adjustment disorder with anxiety: Secondary | ICD-10-CM

## 2020-06-25 ENCOUNTER — Ambulatory Visit (INDEPENDENT_AMBULATORY_CARE_PROVIDER_SITE_OTHER): Payer: 59 | Admitting: Psychology

## 2020-06-25 DIAGNOSIS — F4322 Adjustment disorder with anxiety: Secondary | ICD-10-CM

## 2020-07-02 ENCOUNTER — Ambulatory Visit (INDEPENDENT_AMBULATORY_CARE_PROVIDER_SITE_OTHER): Payer: 59 | Admitting: Psychology

## 2020-07-02 DIAGNOSIS — F4322 Adjustment disorder with anxiety: Secondary | ICD-10-CM

## 2020-07-09 ENCOUNTER — Ambulatory Visit: Payer: 59 | Admitting: Psychology

## 2020-07-16 ENCOUNTER — Ambulatory Visit (INDEPENDENT_AMBULATORY_CARE_PROVIDER_SITE_OTHER): Payer: 59 | Admitting: Psychology

## 2020-07-16 DIAGNOSIS — F4322 Adjustment disorder with anxiety: Secondary | ICD-10-CM | POA: Diagnosis not present

## 2020-07-23 ENCOUNTER — Ambulatory Visit (INDEPENDENT_AMBULATORY_CARE_PROVIDER_SITE_OTHER): Payer: 59 | Admitting: Psychology

## 2020-07-23 DIAGNOSIS — F4322 Adjustment disorder with anxiety: Secondary | ICD-10-CM | POA: Diagnosis not present

## 2020-07-29 ENCOUNTER — Telehealth: Payer: Self-pay | Admitting: Psychiatry

## 2020-07-29 DIAGNOSIS — F902 Attention-deficit hyperactivity disorder, combined type: Secondary | ICD-10-CM

## 2020-07-29 MED ORDER — AMPHETAMINE-DEXTROAMPHET ER 25 MG PO CP24: 25.0000 mg | ORAL_CAPSULE | ORAL | 0 refills | Status: DC

## 2020-07-29 NOTE — Telephone Encounter (Signed)
2 scripts sent so that pt would have adequate supply of medication until next scheduled apt.

## 2020-07-29 NOTE — Telephone Encounter (Signed)
Chris Vega called to request refill of his Adderall.  Appt 09/16/20.  Send to [facility] on [address].

## 2020-07-30 ENCOUNTER — Ambulatory Visit (INDEPENDENT_AMBULATORY_CARE_PROVIDER_SITE_OTHER): Payer: 59 | Admitting: Psychology

## 2020-07-30 DIAGNOSIS — F4322 Adjustment disorder with anxiety: Secondary | ICD-10-CM | POA: Diagnosis not present

## 2020-08-06 ENCOUNTER — Ambulatory Visit (INDEPENDENT_AMBULATORY_CARE_PROVIDER_SITE_OTHER): Payer: 59 | Admitting: Psychology

## 2020-08-06 DIAGNOSIS — F4322 Adjustment disorder with anxiety: Secondary | ICD-10-CM

## 2020-08-13 ENCOUNTER — Ambulatory Visit (INDEPENDENT_AMBULATORY_CARE_PROVIDER_SITE_OTHER): Payer: 59 | Admitting: Psychology

## 2020-08-13 DIAGNOSIS — F4322 Adjustment disorder with anxiety: Secondary | ICD-10-CM

## 2020-08-20 ENCOUNTER — Ambulatory Visit (INDEPENDENT_AMBULATORY_CARE_PROVIDER_SITE_OTHER): Payer: 59 | Admitting: Psychology

## 2020-08-20 DIAGNOSIS — F4322 Adjustment disorder with anxiety: Secondary | ICD-10-CM | POA: Diagnosis not present

## 2020-08-27 ENCOUNTER — Ambulatory Visit: Payer: 59 | Admitting: Psychology

## 2020-09-03 ENCOUNTER — Ambulatory Visit: Payer: 59 | Admitting: Psychology

## 2020-09-10 ENCOUNTER — Ambulatory Visit: Payer: 59 | Admitting: Psychology

## 2020-09-16 ENCOUNTER — Ambulatory Visit: Payer: 59 | Admitting: Psychiatry

## 2020-09-17 ENCOUNTER — Ambulatory Visit: Payer: 59 | Admitting: Psychology

## 2020-09-24 ENCOUNTER — Ambulatory Visit: Payer: 59 | Admitting: Psychology

## 2020-10-01 ENCOUNTER — Ambulatory Visit: Payer: 59 | Admitting: Psychology

## 2020-10-08 ENCOUNTER — Ambulatory Visit: Payer: 59 | Admitting: Psychology

## 2020-10-15 ENCOUNTER — Ambulatory Visit: Payer: 59 | Admitting: Psychology

## 2020-10-22 ENCOUNTER — Ambulatory Visit: Payer: 59 | Admitting: Psychology

## 2020-10-29 ENCOUNTER — Ambulatory Visit: Payer: 59 | Admitting: Psychology

## 2020-11-05 ENCOUNTER — Ambulatory Visit: Payer: 59 | Admitting: Psychology

## 2020-11-12 ENCOUNTER — Ambulatory Visit: Payer: 59 | Admitting: Psychology

## 2020-11-19 ENCOUNTER — Ambulatory Visit: Payer: 59 | Admitting: Psychology

## 2021-07-09 IMAGING — DX DG LUMBAR SPINE COMPLETE 4+V
5 series · 5 of 5 positions shown · non-contrast
Comparison: None.

CLINICAL DATA: Low back pain

EXAM:
LUMBAR SPINE - COMPLETE 4+ VIEW

[lumbar spine ap]
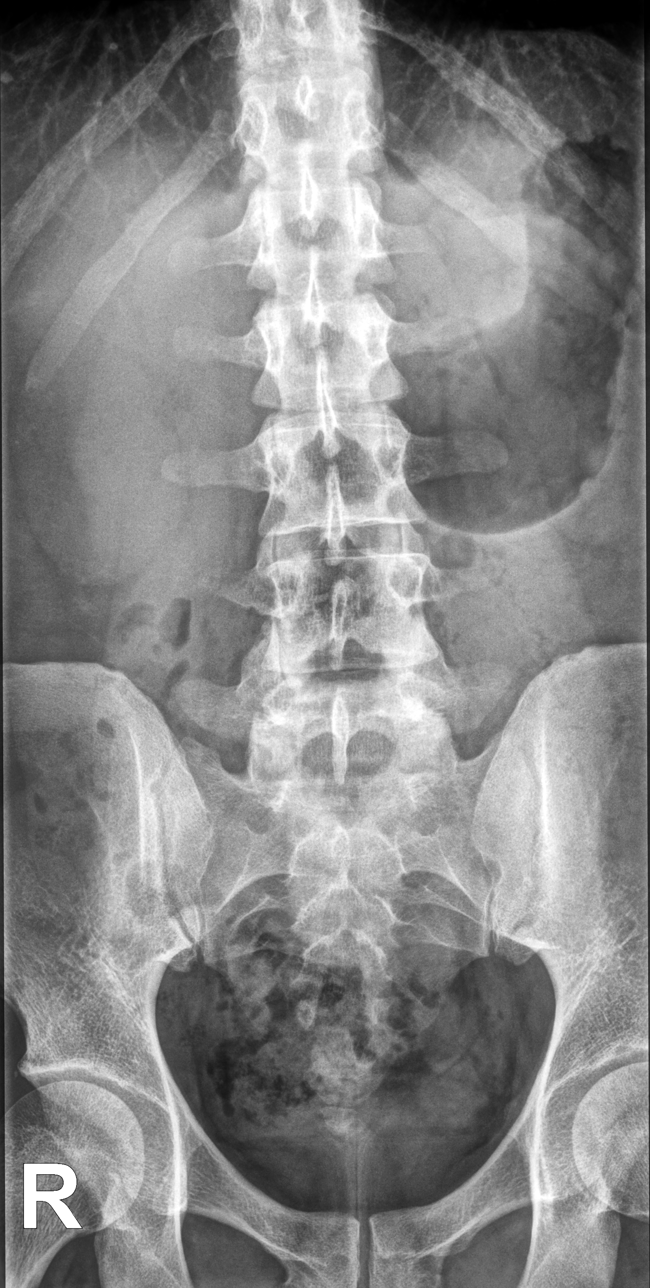

[lumbar spine oblique (1 of 2)]
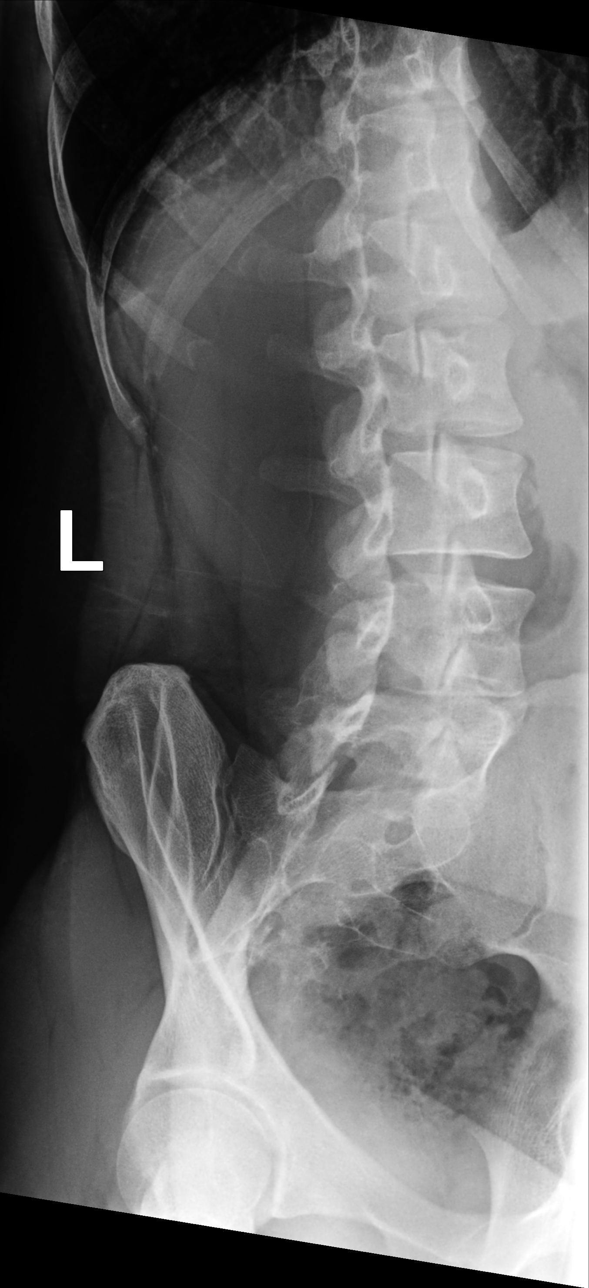

[lumbar spine oblique (2 of 2)]
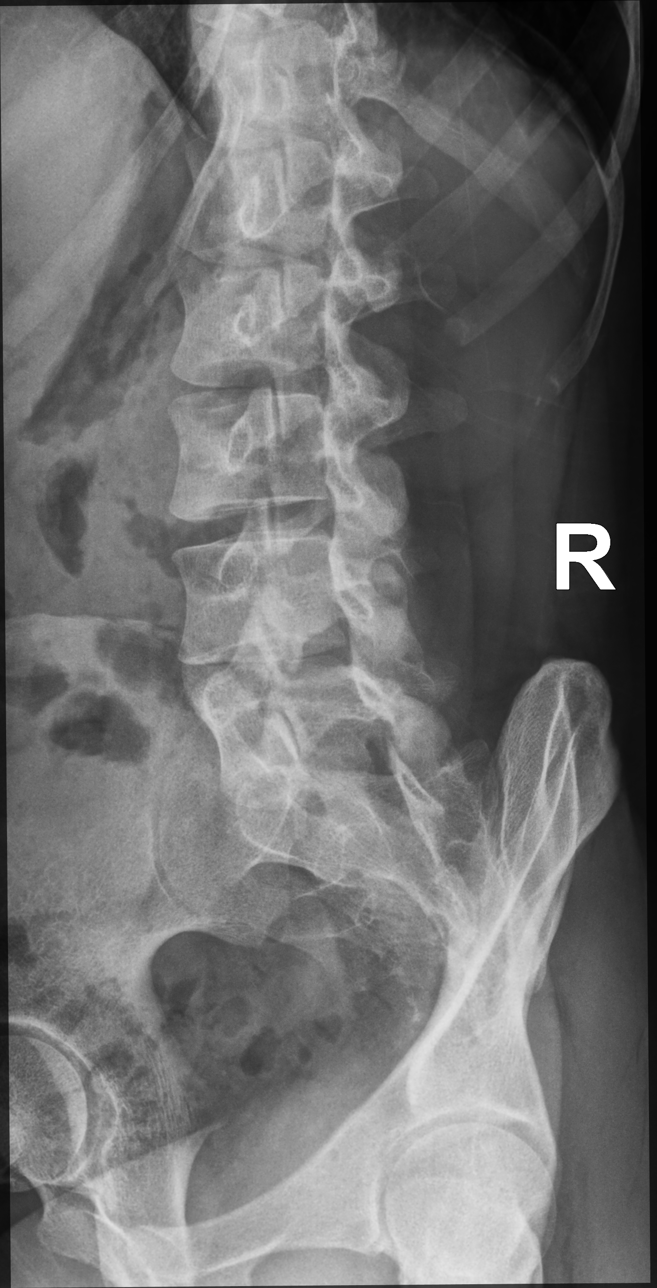

[lumbar spine lat (1 of 2)]
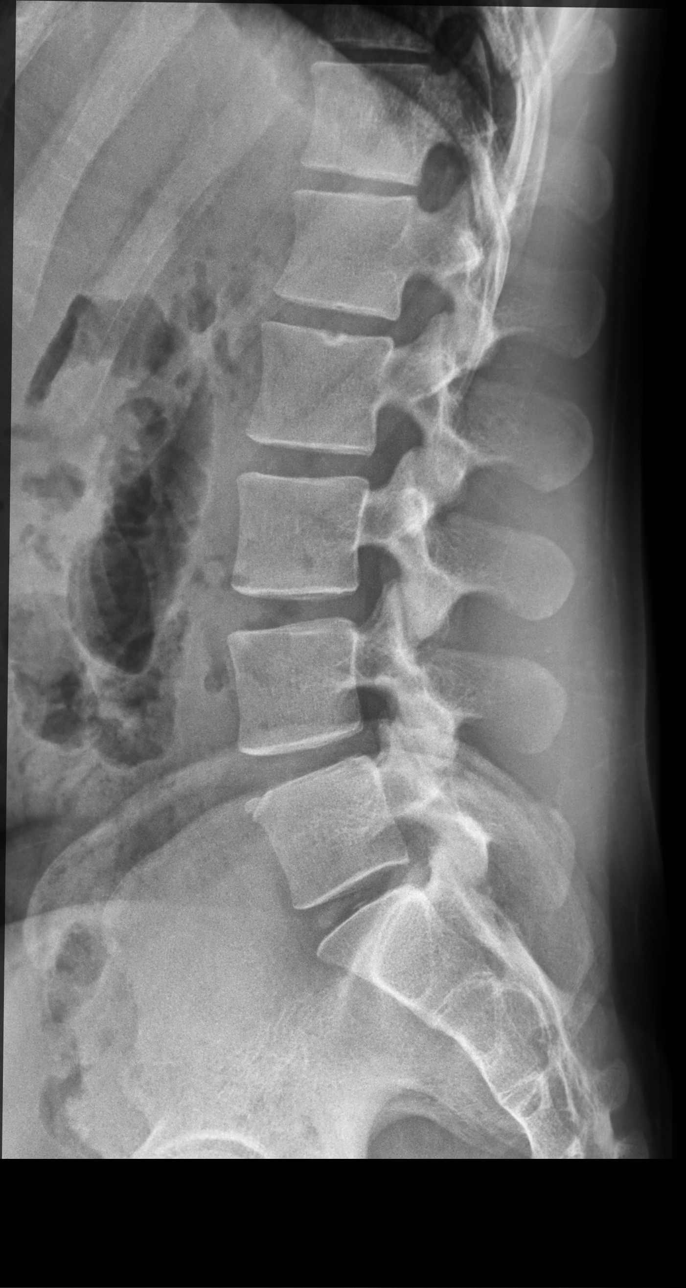

[lumbar spine lat (2 of 2)]
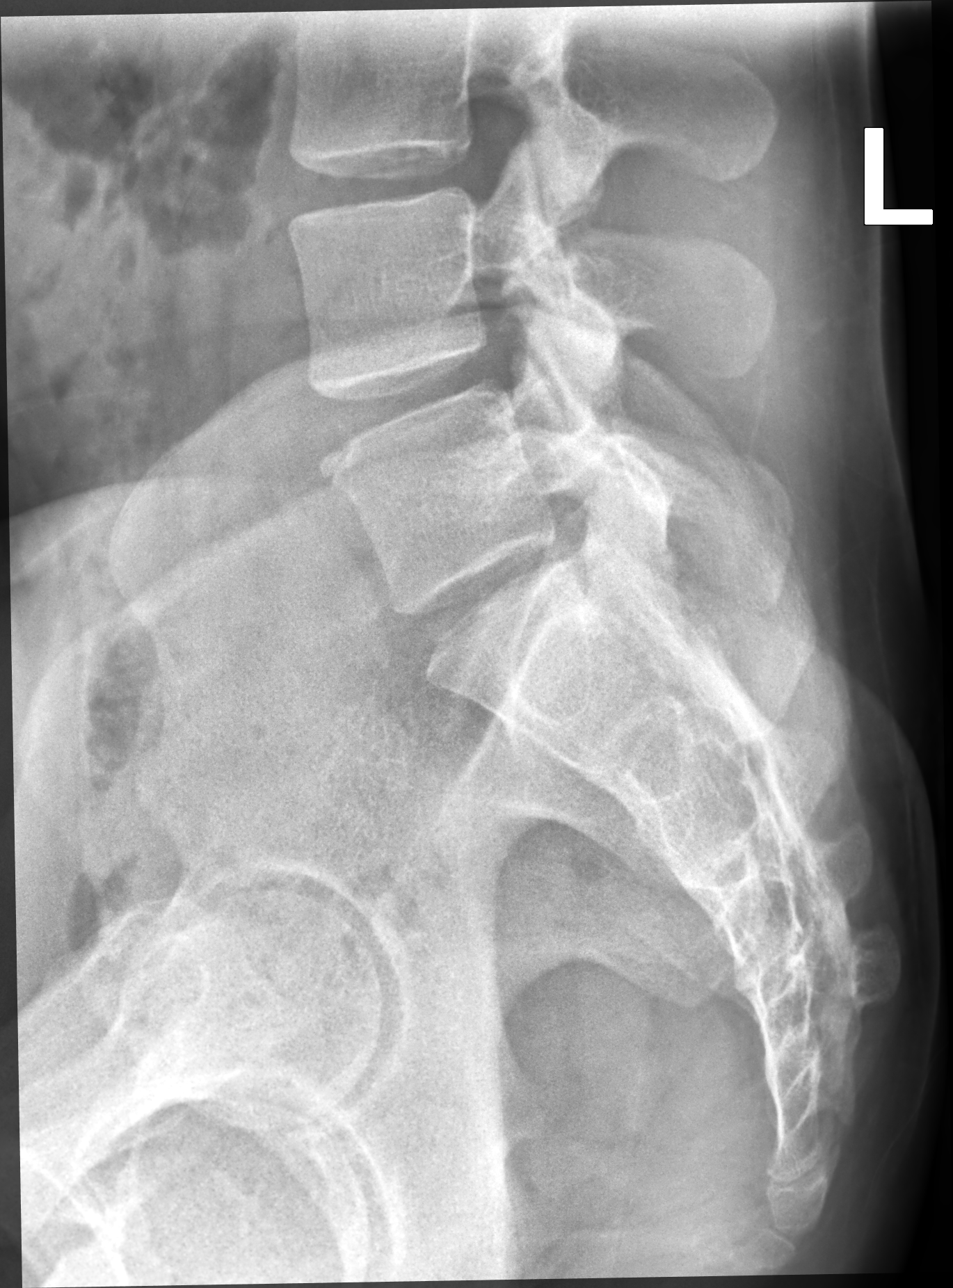

[5 of 5 positions shown; findings below may reference images not displayed]

FINDINGS: Frontal, lateral, spot lumbosacral lateral, and bilateral oblique
views were obtained. There are 5 non-rib-bearing lumbar type
vertebral bodies. There is no fracture or spondylolisthesis. There
is an unfused apophysis along the anterior superior aspect of the L5
vertebral body, an apparent anatomic variant. Disk spaces appear
unremarkable. There is slight facet osteoarthritic change at L4-5
and L5-S1 bilaterally. Facets elsewhere appear normal.
IMPRESSION: Slight facet osteoarthritic change at L4-5 and L5-S1 bilaterally.
Disk spaces appear unremarkable. No fracture or spondylolisthesis.

## 2023-02-09 DIAGNOSIS — M7741 Metatarsalgia, right foot: Secondary | ICD-10-CM | POA: Insufficient documentation

## 2023-02-16 ENCOUNTER — Ambulatory Visit
Admission: RE | Admit: 2023-02-16 | Discharge: 2023-02-16 | Disposition: A | Discharge status: Home / Self Care | Payer: No Typology Code available for payment source | Source: Ambulatory Visit | Attending: Nurse Practitioner | Admitting: Nurse Practitioner

## 2023-02-16 ENCOUNTER — Other Ambulatory Visit: Payer: Self-pay | Admitting: Nurse Practitioner

## 2023-02-16 DIAGNOSIS — Z021 Encounter for pre-employment examination: Secondary | ICD-10-CM

## 2023-02-26 DIAGNOSIS — G5761 Lesion of plantar nerve, right lower limb: Secondary | ICD-10-CM | POA: Insufficient documentation

## 2023-06-06 ENCOUNTER — Ambulatory Visit
Admission: EM | Admit: 2023-06-06 | Discharge: 2023-06-06 | Disposition: A | Discharge status: Home / Self Care | Payer: Medicaid Other

## 2023-06-06 DIAGNOSIS — L304 Erythema intertrigo: Secondary | ICD-10-CM

## 2023-06-06 MED ORDER — FLUCONAZOLE 150 MG PO TABS: 150.0000 mg | ORAL_TABLET | ORAL | 0 refills | Status: AC

## 2023-06-06 MED ORDER — KETOCONAZOLE 2 % EX CREA: 1.0000 | TOPICAL_CREAM | Freq: Every day | CUTANEOUS | 0 refills | Status: DC

## 2023-06-06 NOTE — ED Triage Notes (Signed)
Patient presents to [facility] for left armpit rash x 2 months. Has used OTC creams with no improvement. Concerned with fungal infection.

## 2023-06-23 NOTE — ED Provider Notes (Signed)
[facility]    CSN: [id number] Arrival date & time: 06/06/23  1914      History   Chief Complaint Chief Complaint  Patient presents with   Rash    HPI Chris Vega is a 32 y.o. male.   Here today for evaluation of rash under his left armpit has had for 2 months.  He has tried over-the-counter creams with no improvement.  He is concerned for possible fungal infection.  He denies any fever.  He reports trying over-the-counter topical treatment without resolution.  The history is provided by the patient.  Rash Associated symptoms: no fever and no shortness of breath     Past Medical History:  Diagnosis Date   Allergic rhinitis     Patient Active Problem List   Diagnosis Date Noted   Morton's neuroma of right foot 02/26/2023   Metatarsalgia of right foot 02/09/2023   ADHD (attention deficit hyperactivity disorder), combined type 09/12/2019   Anxiety disorder, unspecified 09/12/2019    History reviewed. No pertinent surgical history.     Home Medications    Prior to Admission medications   Medication Sig Start Date End Date Taking? Authorizing Provider  fluconazole (DIFLUCAN) 150 MG tablet Take 1 tablet (150 mg total) by mouth once a week. 06/06/23 07/06/23 Yes Tomi Bamberger, PA-C  ketoconazole (NIZORAL) 2 % cream Apply 1 Application topically daily. 06/06/23  Yes Tomi Bamberger, PA-C  meloxicam (MOBIC) 7.5 MG tablet TAKE 1 TABLET BY MOUTH TWICE A DAY FOR 2 WEEKS AND THEN AS NEEDED 10/22/22  Yes [provider]  amphetamine-dextroamphetamine (ADDERALL XR) 25 MG 24 hr capsule Take 1 capsule by mouth every morning. 08/26/20   Corie Chiquito, PMHNP  amphetamine-dextroamphetamine (ADDERALL XR) 25 MG 24 hr capsule Take 1 capsule by mouth every morning. 07/29/20   Corie Chiquito, PMHNP  B Complex Vitamins (B COMPLEX PO) Take by mouth.    [provider]  buPROPion (WELLBUTRIN XL) 150 MG 24 hr tablet Take 1 tablet (150 mg total) by  mouth daily. 05/28/20   Corie Chiquito, PMHNP  FLUoxetine (PROZAC) 10 MG capsule TAKE 1 CAPSULE BY MOUTH EVERY DAY 05/14/20   Corie Chiquito, PMHNP  Multiple Vitamin (MULTIVITAMIN) tablet Take 1 tablet by mouth daily.    [provider]    Family History Family History  Problem Relation Age of Onset   Lung cancer Maternal Grandfather    Breast cancer Paternal Grandmother    OCD Mother     Social History Social History   Tobacco Use   Smoking status: Never   Smokeless tobacco: Never  Vaping Use   Vaping status: Former   Quit date: 06/03/2017  Substance Use Topics   Alcohol use: Yes    Comment: social drinker   Drug use: Not Currently    Types: Marijuana    Comment: quit 2 years ago     Allergies   Patient has no known allergies.   Review of Systems Review of Systems  Constitutional:  Negative for chills and fever.  Eyes:  Negative for discharge and redness.  Respiratory:  Negative for shortness of breath.   Skin:  Positive for rash.  Neurological:  Negative for numbness.     Physical Exam Triage Vital Signs ED Triage Vitals [06/06/23 0900]  Encounter Vitals Group     BP (!) 159/87     Systolic BP Percentile      Diastolic BP Percentile      Pulse Rate 72  Resp 16     Temp 98.4 F (36.9 C)     Temp Source Oral     SpO2 96 %     Weight      Height      Head Circumference      Peak Flow      Pain Score 0     Pain Loc      Pain Education      Exclude from Growth Chart    No data found.  Updated Vital Signs BP (!) 159/87 (BP Location: Left Arm)   Pulse 72   Temp 98.4 F (36.9 C) (Oral)   Resp 16   SpO2 96%   Visual Acuity Right Eye Distance:   Left Eye Distance:   Bilateral Distance:    Right Eye Near:   Left Eye Near:    Bilateral Near:     Physical Exam Vitals and nursing note reviewed.  Constitutional:      General: He is not in acute distress.    Appearance: Normal appearance. He is not ill-appearing.  HENT:      Head: Normocephalic and atraumatic.  Eyes:     Conjunctiva/sclera: Conjunctivae normal.  Cardiovascular:     Rate and Rhythm: Normal rate.  Pulmonary:     Effort: Pulmonary effort is normal. No respiratory distress.  Skin:    Comments: Confluent erythematous macular eruption with well-demarcated borders to left axilla  Neurological:     Mental Status: He is alert.  Psychiatric:        Mood and Affect: Mood normal.        Behavior: Behavior normal.        Thought Content: Thought content normal.      UC Treatments / Results  Labs (all labs ordered are listed, but only abnormal results are displayed) Labs Reviewed - No data to display  EKG   Radiology No results found.  Procedures Procedures (including critical care time)  Medications Ordered in UC Medications - No data to display  Initial Impression / Assessment and Plan / UC Course  I have reviewed the triage vital signs and the nursing notes.  Pertinent labs & imaging results that were available during my care of the patient were reviewed by me and considered in my medical decision making (see chart for details).    Will treat to cover intertrigo with fluconazole as well as topical ketoconazole.  Recommended follow-up if no gradual improvement with any further concerns.  Final Clinical Impressions(s) / UC Diagnoses   Final diagnoses:  Intertrigo   Discharge Instructions   None    ED Prescriptions     Medication Sig Dispense Auth. Provider   fluconazole (DIFLUCAN) 150 MG tablet Take 1 tablet (150 mg total) by mouth once a week. 4 tablet Erma Pinto F, PA-C   ketoconazole (NIZORAL) 2 % cream Apply 1 Application topically daily. 15 g Tomi Bamberger, PA-C      PDMP not reviewed this encounter.   Tomi Bamberger, PA-C 06/23/23 2049

## 2023-07-07 ENCOUNTER — Encounter: Payer: Self-pay | Admitting: Psychiatry

## 2024-01-26 ENCOUNTER — Other Ambulatory Visit: Payer: Self-pay

## 2024-01-26 ENCOUNTER — Encounter (HOSPITAL_BASED_OUTPATIENT_CLINIC_OR_DEPARTMENT_OTHER): Payer: Self-pay | Admitting: Orthopaedic Surgery

## 2024-01-26 NOTE — Progress Notes (Signed)
   01/26/24 1109  Pre-op Phone Call  Surgery Date Verified 02/02/24  Arrival Time Verified 0745  Surgery Location Verified [facility]   Medical History Reviewed Yes  Is the patient taking a GLP-1 receptor agonist? No  Does the patient have diabetes? No diagnosis of diabetes  Do you have a history of heart problems? No  Does patient have other implanted devices? No  Patient Teaching Pre / Post Procedure  Patient educated about smoking cessation 24 hours prior to surgery. N/A Non-Smoker  Patient verbalizes understanding of bowel prep? N/A  Med Rec Completed Yes  Take the Following Meds the Morning of Surgery doesn't take any meds; hold nsaids 5 d  Recent  Lab Work, EKG, CXR? No  NPO (Including gum & candy) After midnight  Stop Solids, Milk, Candy, and Gum STARTING AT MIDNIGHT  Responsible adult to drive and be with you for 24 hours? Yes  Name & Phone Number for Ride/Caregiver sig other, Margretta Shi will provide transportation and 24 hr care  No Jewelry, money, nail polish or make-up.  No lotions, powders, perfumes. No shaving  48 hrs. prior to surgery. Yes  Contacts, Dentures & Glasses Will Have to be Removed Before OR. Yes  Please bring your ID and Insurance Card the morning of your surgery. (Surgery Centers Only) Yes  Bring any papers or x-rays with you that your surgeon gave you. Yes  Instructed to contact the location of procedure/ provider if they or anyone in their household develops symptoms or tests positive for COVID-19, has close contact with someone who tests positive for COVID, or has known exposure to any contagious illness. Yes  Call this number the morning of surgery  with any problems that may cancel your surgery. [phone]  Covid-19 Assessment  Have you had a positive COVID-19 test within the previous 90 days? No  COVID Testing Guidance Proceed with the additional questions.  Patient's surgery required a COVID-19 test (cardiothoracic, complex ENT, and bronchoscopies/ EBUS) No   Have you been unmasked and in close contact with anyone with COVID-19 or COVID-19 symptoms within the past 10 days? No  Do you or anyone in your household currently have any COVID-19 symptoms? No

## 2024-01-30 NOTE — H&P (Signed)
 ORTHOPAEDIC SURGERY H&P  Subjective:  The patient presents with right foot 2nd webspace neuroma.   Past Medical History:  Diagnosis Date   Allergic rhinitis     Past Surgical History:  Procedure Laterality Date   arm laceration Right 2004     (Not in an outpatient encounter)    No Known Allergies  Social History   Socioeconomic History   Marital status: Married    Spouse name: Not on file   Number of children: Not on file   Years of education: Not on file   Highest education level: Not on file  Occupational History   Not on file  Tobacco Use   Smoking status: Never   Smokeless tobacco: Never  Vaping Use   Vaping status: Former   Quit date: 06/03/2017  Substance and Sexual Activity   Alcohol use: Yes    Comment: social drinker   Drug use: Not Currently    Types: Marijuana    Comment: quit 2 years ago   Sexual activity: Not on file  Other Topics Concern   Not on file  Social History Narrative   Not on file   Social Drivers of Health   Financial Resource Strain: Not on file  Food Insecurity: Not on file  Transportation Needs: Not on file  Physical Activity: Not on file  Stress: Not on file  Social Connections: Not on file  Intimate Partner Violence: Not on file     History reviewed. No pertinent family history.   Review of Systems Pertinent items are noted in HPI.  Objective: Vital signs in last 24 hours:    01/26/2024   11:09 AM 06/06/2023    9:00 AM 05/28/2020   10:17 AM  Vitals with BMI  Height 5\' 7"     Weight 185 lbs  152 lbs  BMI 28.97    Systolic  159   Diastolic  87   Pulse  72 84      EXAM: General: Well nourished, well developed. Awake, alert and oriented to time, place, person. Normal mood and affect. No apparent distress. Breathing room air.  Operative Lower Extremity: Alignment - Neutral Deformity - None Skin intact Tenderness to palpation - right foot 2nd webspace 5/5 TA, PT, GS, Per, EHL, FHL Sensation intact to  light touch throughout Palpable DP and PT pulses Special testing: None  The contralateral foot/ankle was examined for comparison and noted to be neurovascularly intact with no localized deformity, swelling, or tenderness.  Imaging Review All images taken were independently reviewed by me.  Assessment/Plan: The clinical and radiographic findings were reviewed and discussed at length with the patient.  The patient has right foot 2nd webspace Morton's neuroma.  We spoke at length about the natural course of these findings. We discussed nonoperative and operative treatment options in detail.  The risks and benefits were presented and reviewed. The risks due to recurrence, suture failure/irritation, new/persistent/recurrent infection, stiffness, nerve/vessel/tendon injury, nonunion/malunion of any fracture, wound healing issues, allograft usage, development of arthritis, failure of this surgery, possibility of external fixation in certain situations, possibility of delayed definitive surgery, need for further surgery, prolonged wound care including further soft tissue coverage procedures, thromboembolic events, anesthesia/medical complications/events perioperatively and beyond, amputation, death among others were discussed. The patient acknowledged the explanation and agreed to proceed with the plan.  Ali Ink  Orthopaedic Surgery [facility]

## 2024-01-30 NOTE — Discharge Instructions (Signed)
 Netta Cedars, MD [facility]  Please read the following information regarding your care after surgery.  Medications  You only need a prescription for the narcotic pain medicine (ex. oxycodone, Percocet, Norco).  All of the other medicines listed below are available over the counter. ? Aleve 2 pills twice a day for the first 3 days after surgery. ? acetominophen (Tylenol) 650 mg every 4-6 hours as you need for minor to moderate pain ? oxycodone as prescribed for severe pain  ? To help prevent blood clots, take aspirin (81 mg) twice daily for 28 days after surgery.  You should also get up every hour while you are awake to move around.  Weight Bearing ? OK to walk on the operative leg only AFTER the nerve block has completely worn off.  Cast / Splint / Dressing ? Keep your dressing clean and dry.  Don't put anything (coat hanger, pencil, etc) down inside of it.  If it gets wet, please notify the office immediately.  Swelling IMPORTANT: It is normal for you to have swelling where you had surgery. To reduce swelling and pain, keep at least 3 pillows under your leg so that your toes are above your nose and your heel is above the level of your hip.  It may be necessary to keep your foot or leg elevated for several weeks.  This is critical to helping your incisions heal and your pain to feel better.  Follow Up Call my office at [phone] when you are discharged from the hospital or surgery center to schedule an appointment to be seen within 7-10 days after surgery.  Call my office at [phone] if you develop a fever >101.5 F, nausea, vomiting, bleeding from the surgical site or severe pain.   Post Anesthesia Home Care Instructions  Activity: Get plenty of rest for the remainder of the day. A responsible individual must stay with you for 24 hours following the procedure.  For the next 24 hours, DO NOT: -Drive a car -Advertising copywriter -Drink alcoholic beverages -Take any  medication unless instructed by your physician -Make any legal decisions or sign important papers.  Meals: Start with liquid foods such as gelatin or soup. Progress to regular foods as tolerated. Avoid greasy, spicy, heavy foods. If nausea and/or vomiting occur, drink only clear liquids until the nausea and/or vomiting subsides. Call your physician if vomiting continues.  Special Instructions/Symptoms: Your throat may feel dry or sore from the anesthesia or the breathing tube placed in your throat during surgery. If this causes discomfort, gargle with warm salt water. The discomfort should disappear within 24 hours.  If you had a scopolamine patch placed behind your ear for the management of post- operative nausea and/or vomiting:  1. The medication in the patch is effective for 72 hours, after which it should be removed.  Wrap patch in a tissue and discard in the trash. Wash hands thoroughly with soap and water. 2. You may remove the patch earlier than 72 hours if you experience unpleasant side effects which may include dry mouth, dizziness or visual disturbances. 3. Avoid touching the patch. Wash your hands with soap and water after contact with the patch.    Post Anesthesia Home Care Instructions  Activity: Get plenty of rest for the remainder of the day. A responsible individual must stay with you for 24 hours following the procedure.  For the next 24 hours, DO NOT: -Drive a car -Advertising copywriter -Drink alcoholic beverages -Take any medication unless instructed by your

## 2024-02-02 ENCOUNTER — Encounter (HOSPITAL_BASED_OUTPATIENT_CLINIC_OR_DEPARTMENT_OTHER)
Admission: RE | Disposition: A | Discharge status: Home / Self Care | Payer: Self-pay | Source: Home / Self Care | Attending: Orthopaedic Surgery

## 2024-02-02 ENCOUNTER — Ambulatory Visit (HOSPITAL_BASED_OUTPATIENT_CLINIC_OR_DEPARTMENT_OTHER): Admitting: Anesthesiology

## 2024-02-02 ENCOUNTER — Ambulatory Visit (HOSPITAL_BASED_OUTPATIENT_CLINIC_OR_DEPARTMENT_OTHER)
Admission: RE | Admit: 2024-02-02 | Discharge: 2024-02-02 | Disposition: A | Discharge status: Home / Self Care | Attending: Orthopaedic Surgery | Admitting: Orthopaedic Surgery

## 2024-02-02 ENCOUNTER — Encounter (HOSPITAL_BASED_OUTPATIENT_CLINIC_OR_DEPARTMENT_OTHER): Payer: Self-pay | Admitting: Orthopaedic Surgery

## 2024-02-02 ENCOUNTER — Other Ambulatory Visit: Payer: Self-pay

## 2024-02-02 DIAGNOSIS — G5761 Lesion of plantar nerve, right lower limb: Secondary | ICD-10-CM

## 2024-02-02 HISTORY — PX: EXCISION MORTON'S NEUROMA: SHX5013

## 2024-02-02 SURGERY — EXCISION, MORTON'S NEUROMA
Anesthesia: General | Laterality: Right

## 2024-02-02 MED ORDER — MIDAZOLAM HCL 2 MG/2ML IJ SOLN
INTRAMUSCULAR | Status: AC
  Filled 2024-02-02: qty 2

## 2024-02-02 MED ORDER — FENTANYL CITRATE (PF) 100 MCG/2ML IJ SOLN
INTRAMUSCULAR | Status: AC
Start: 2024-02-02 — End: 2024-02-02
  Filled 2024-02-02: qty 2

## 2024-02-02 MED ORDER — BUPIVACAINE HCL (PF) 0.25 % IJ SOLN
INTRAMUSCULAR | Status: AC
  Filled 2024-02-02: qty 30

## 2024-02-02 MED ORDER — BUPIVACAINE HCL (PF) 0.25 % IJ SOLN
INTRAMUSCULAR | Status: DC | PRN
  Administered 2024-02-02: 10 mL

## 2024-02-02 MED ORDER — ACETAMINOPHEN 10 MG/ML IV SOLN
INTRAVENOUS | Status: DC | PRN
Start: 2024-02-02 — End: 2024-02-02
  Administered 2024-02-02: 1000 mg via INTRAVENOUS

## 2024-02-02 MED ORDER — VANCOMYCIN HCL 500 MG IV SOLR
INTRAVENOUS | Status: DC | PRN
Start: 2024-02-02 — End: 2024-02-02
  Administered 2024-02-02: 500 mg

## 2024-02-02 MED ORDER — MIDAZOLAM HCL 5 MG/5ML IJ SOLN
INTRAMUSCULAR | Status: DC | PRN
  Administered 2024-02-02: 2 mg via INTRAVENOUS

## 2024-02-02 MED ORDER — 0.9 % SODIUM CHLORIDE (POUR BTL) OPTIME
TOPICAL | Status: DC | PRN
  Administered 2024-02-02: 1000 mL

## 2024-02-02 MED ORDER — CHLORHEXIDINE GLUCONATE 4 % EX SOLN: 60.0000 mL | Freq: Once | CUTANEOUS | Status: DC

## 2024-02-02 MED ORDER — ONDANSETRON HCL 4 MG/2ML IJ SOLN
INTRAMUSCULAR | Status: AC
  Filled 2024-02-02: qty 2

## 2024-02-02 MED ORDER — PROPOFOL 10 MG/ML IV BOLUS
INTRAVENOUS | Status: AC
  Filled 2024-02-02: qty 20

## 2024-02-02 MED ORDER — OXYCODONE HCL 5 MG PO TABS
ORAL_TABLET | ORAL | Status: AC
  Filled 2024-02-02: qty 1

## 2024-02-02 MED ORDER — FENTANYL CITRATE (PF) 100 MCG/2ML IJ SOLN
INTRAMUSCULAR | Status: DC | PRN
  Administered 2024-02-02: 100 ug via INTRAVENOUS

## 2024-02-02 MED ORDER — CEFAZOLIN SODIUM-DEXTROSE 2-4 GM/100ML-% IV SOLN
INTRAVENOUS | Status: AC
  Filled 2024-02-02: qty 100

## 2024-02-02 MED ORDER — DEXAMETHASONE SODIUM PHOSPHATE 10 MG/ML IJ SOLN
INTRAMUSCULAR | Status: AC
  Filled 2024-02-02: qty 1

## 2024-02-02 MED ORDER — LACTATED RINGERS IV SOLN: INTRAVENOUS | Status: DC

## 2024-02-02 MED ORDER — LIDOCAINE 2% (20 MG/ML) 5 ML SYRINGE
INTRAMUSCULAR | Status: DC | PRN
  Administered 2024-02-02: 60 mg via INTRAVENOUS

## 2024-02-02 MED ORDER — PROPOFOL 10 MG/ML IV BOLUS
INTRAVENOUS | Status: DC | PRN
  Administered 2024-02-02: 200 mg via INTRAVENOUS

## 2024-02-02 MED ORDER — ONDANSETRON HCL 4 MG/2ML IJ SOLN
INTRAMUSCULAR | Status: DC | PRN
  Administered 2024-02-02: 4 mg via INTRAVENOUS

## 2024-02-02 MED ORDER — DEXAMETHASONE SODIUM PHOSPHATE 4 MG/ML IJ SOLN
INTRAMUSCULAR | Status: DC | PRN
  Administered 2024-02-02: 5 mg via INTRAVENOUS

## 2024-02-02 MED ORDER — LIDOCAINE 2% (20 MG/ML) 5 ML SYRINGE
INTRAMUSCULAR | Status: AC
  Filled 2024-02-02: qty 5

## 2024-02-02 MED ORDER — CEFAZOLIN SODIUM-DEXTROSE 2-4 GM/100ML-% IV SOLN
2.0000 g | INTRAVENOUS | Status: AC
  Administered 2024-02-02: 2 g via INTRAVENOUS

## 2024-02-02 MED ORDER — DEXMEDETOMIDINE HCL IN NACL 80 MCG/20ML IV SOLN
INTRAVENOUS | Status: DC | PRN
Start: 2024-02-02 — End: 2024-02-02
  Administered 2024-02-02: 16 ug via INTRAVENOUS

## 2024-02-02 MED ORDER — OXYCODONE HCL 5 MG PO TABS
5.0000 mg | ORAL_TABLET | Freq: Once | ORAL | Status: AC | PRN
  Administered 2024-02-02: 5 mg via ORAL

## 2024-02-02 MED ORDER — SUCCINYLCHOLINE CHLORIDE 200 MG/10ML IV SOSY
PREFILLED_SYRINGE | INTRAVENOUS | Status: AC
  Filled 2024-02-02: qty 10

## 2024-02-02 MED ORDER — ACETAMINOPHEN 10 MG/ML IV SOLN
INTRAVENOUS | Status: AC
  Filled 2024-02-02: qty 100

## 2024-02-02 MED ORDER — KETOROLAC TROMETHAMINE 30 MG/ML IJ SOLN
INTRAMUSCULAR | Status: DC | PRN
  Administered 2024-02-02: 30 mg via INTRAVENOUS

## 2024-02-02 MED ORDER — HYDROMORPHONE HCL 1 MG/ML IJ SOLN
INTRAMUSCULAR | Status: AC
  Filled 2024-02-02: qty 0.5

## 2024-02-02 MED ORDER — EPHEDRINE 5 MG/ML INJ
INTRAVENOUS | Status: AC
  Filled 2024-02-02: qty 5

## 2024-02-02 MED ORDER — HYDROMORPHONE HCL 1 MG/ML IJ SOLN
0.2500 mg | INTRAMUSCULAR | Status: DC | PRN
  Administered 2024-02-02: 0.5 mg via INTRAVENOUS

## 2024-02-02 MED ORDER — PHENYLEPHRINE 80 MCG/ML (10ML) SYRINGE FOR IV PUSH (FOR BLOOD PRESSURE SUPPORT)
PREFILLED_SYRINGE | INTRAVENOUS | Status: AC
  Filled 2024-02-02: qty 10

## 2024-02-02 SURGICAL SUPPLY — 41 items
BANDAGE ESMARK 6X9 LF (GAUZE/BANDAGES/DRESSINGS) IMPLANT
BLADE SURG 15 STRL LF DISP TIS (BLADE) ×2 IMPLANT
BNDG COHESIVE 4X5 TAN STRL LF (GAUZE/BANDAGES/DRESSINGS) ×1 IMPLANT
BNDG ELASTIC 4INX 5YD STR LF (GAUZE/BANDAGES/DRESSINGS) ×1 IMPLANT
BNDG GAUZE DERMACEA FLUFF 4 (GAUZE/BANDAGES/DRESSINGS) ×1 IMPLANT
CANISTER SUCT 1200ML W/VALVE (MISCELLANEOUS) IMPLANT
CHLORAPREP W/TINT 26 (MISCELLANEOUS) ×1 IMPLANT
COVER BACK TABLE 60X90IN (DRAPES) ×1 IMPLANT
CUFF TRNQT CYL 34X4.125X (TOURNIQUET CUFF) ×1 IMPLANT
DRAPE EXTREMITY T 121X128X90 (DISPOSABLE) ×1 IMPLANT
DRAPE IMP U-DRAPE 54X76 (DRAPES) ×1 IMPLANT
DRAPE U-SHAPE 47X51 STRL (DRAPES) ×1 IMPLANT
DRSG MEPITEL 4X7.2 (GAUZE/BANDAGES/DRESSINGS) ×1 IMPLANT
ELECTRODE REM PT RTRN 9FT ADLT (ELECTROSURGICAL) ×1 IMPLANT
GAUZE PAD ABD 8X10 STRL (GAUZE/BANDAGES/DRESSINGS) IMPLANT
GAUZE SPONGE 4X4 12PLY STRL (GAUZE/BANDAGES/DRESSINGS) ×1 IMPLANT
GLOVE BIOGEL PI IND STRL 8 (GLOVE) ×1 IMPLANT
GLOVE SURG SS PI 7.5 STRL IVOR (GLOVE) ×1 IMPLANT
GOWN STRL REUS W/ TWL LRG LVL3 (GOWN DISPOSABLE) ×2 IMPLANT
NDL HYPO 22X1.5 SAFETY MO (MISCELLANEOUS) IMPLANT
NEEDLE HYPO 22X1.5 SAFETY MO (MISCELLANEOUS) IMPLANT
NS IRRIG 1000ML POUR BTL (IV SOLUTION) ×1 IMPLANT
PACK BASIN DAY SURGERY FS (CUSTOM PROCEDURE TRAY) ×1 IMPLANT
PAD CAST 4YDX4 CTTN HI CHSV (CAST SUPPLIES) IMPLANT
PADDING CAST ABS COTTON 4X4 ST (CAST SUPPLIES) IMPLANT
PENCIL SMOKE EVACUATOR (MISCELLANEOUS) IMPLANT
SHEET MEDIUM DRAPE 40X70 STRL (DRAPES) ×1 IMPLANT
SLEEVE SCD COMPRESS KNEE MED (STOCKING) ×1 IMPLANT
SPIKE FLUID TRANSFER (MISCELLANEOUS) IMPLANT
SPONGE T-LAP 18X18 ~~LOC~~+RFID (SPONGE) ×1 IMPLANT
STOCKINETTE 6 STRL (DRAPES) IMPLANT
SUCTION TUBE FRAZIER 10FR DISP (SUCTIONS) ×1 IMPLANT
SUT ETHILON 2 0 FS 18 (SUTURE) ×1 IMPLANT
SUT ETHILON 3 0 PS 1 (SUTURE) IMPLANT
SUT VIC AB 2-0 SH 27XBRD (SUTURE) IMPLANT
SUT VIC AB 3-0 PS2 18XBRD (SUTURE) IMPLANT
SUT VICRYL 0 SH 27 (SUTURE) IMPLANT
SYR BULB EAR ULCER 3OZ GRN STR (SYRINGE) ×1 IMPLANT
TOWEL GREEN STERILE FF (TOWEL DISPOSABLE) ×2 IMPLANT
TUBE CONNECTING 20X1/4 (TUBING) ×1 IMPLANT
UNDERPAD 30X36 HEAVY ABSORB (UNDERPADS AND DIAPERS) ×1 IMPLANT

## 2024-02-02 NOTE — Op Note (Signed)
 02/02/2024  5:03 PM   PATIENT: Chris Vega  33 y.o. 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   INDICATION FOR PROCEDURE: The patient presented with above diagnosis.  We discussed the diagnosis, alternative treatment options, risks and benefits of the above surgical intervention, as well as alternative non-operative treatments. All questions/concerns were addressed and the patient/family demonstrated appropriate understanding of the diagnosis, the procedure, the postoperative course, and overall prognosis. The patient wished to proceed with surgical intervention and signed an informed surgical consent as such, in each others presence prior to surgery.   PROCEDURE IN DETAIL: After preoperative consent was obtained and the correct operative site was identified, the patient was brought to the operating room supine on stretcher and transferred onto operating table. General anesthesia was induced. Preoperative antibiotics were administered. Surgical timeout was taken. The patient was then positioned supine with an ipsilateral hip bump. The operative lower extremity was prepped and draped in standard sterile fashion with a tourniquet around the thigh. The extremity was exsanguinated and the tourniquet was inflated to 275 mmHg.  We began by making a standard dorsal approach in the aforementioned intermetatarsal space. Dissection was carefully carried down to the level of the transverse ligament taking care to  protect tendon and neurovascular structures. This ligament was sharply released revealing a large Morton's neuroma. This neuroma was excised including its branches and stalk traced proximally.   The surgical sites were thoroughly irrigated. The tourniquet was deflated and hemostasis achieved. Excellent capillary refill noted in all toes. Betadine and vancomycin powder were applied. The deep layers were closed using 2-0 vicryl. The skin was closed without tension.    The leg was cleaned with saline and sterile dressings with gauze were applied. A well padded bulky wrap was applied. The patient was awakened from anesthesia and transported to the recovery room in stable condition.      FOLLOW UP PLAN: -transfer to PACU, then home -heel WB once nerve block wears off, maximum elevation -maintain dressings until follow up -DVT ppx: Aspirin 81 mg twice daily x 1 month -follow up as outpatient within 7-10 days for wound check -sutures out in 2-3 weeks in outpatient office     RADIOGRAPHS: None   Ali Ink Orthopaedic Surgery [facility]

## 2024-02-02 NOTE — Anesthesia Preprocedure Evaluation (Signed)
 Anesthesia Evaluation  Patient identified by MRN, date of birth, ID band Patient awake    Reviewed: Allergy & Precautions, H&P , NPO status , Patient's Chart, lab work & pertinent test results  Airway Mallampati: II  TM Distance: >3 FB Neck ROM: Full    Dental no notable dental hx. (+) Teeth Intact, Dental Advisory Given   Pulmonary neg pulmonary ROS   Pulmonary exam normal breath sounds clear to auscultation       Cardiovascular negative cardio ROS  Rhythm:Regular Rate:Normal     Neuro/Psych   Anxiety     negative neurological ROS     GI/Hepatic negative GI ROS, Neg liver ROS,,,  Endo/Other  negative endocrine ROS    Renal/GU negative Renal ROS  negative genitourinary   Musculoskeletal   Abdominal   Peds  Hematology negative hematology ROS (+)   Anesthesia Other Findings   Reproductive/Obstetrics negative OB ROS                             Anesthesia Physical Anesthesia Plan  ASA: 1  Anesthesia Plan: General   Post-op Pain Management: Ofirmev IV (intra-op)* and Toradol  IV (intra-op)*   Induction: Intravenous  PONV Risk Score and Plan: 3 and Ondansetron, Dexamethasone and Midazolam  Airway Management Planned: LMA  Additional Equipment:   Intra-op Plan:   Post-operative Plan: Extubation in OR  Informed Consent: I have reviewed the patients History and Physical, chart, labs and discussed the procedure including the risks, benefits and alternatives for the proposed anesthesia with the patient or authorized representative who has indicated his/her understanding and acceptance.     Dental advisory given  Plan Discussed with: CRNA  Anesthesia Plan Comments:        Anesthesia Quick Evaluation

## 2024-02-02 NOTE — Transfer of Care (Signed)
 Immediate Anesthesia Transfer of Care Note  Patient: Chris Vega  Procedure(s) Performed: EXCISION, MORTON'S NEUROMA (Right)  Patient Location: PACU  Anesthesia Type:General  Level of Consciousness: sedated  Airway & Oxygen Therapy: Patient connected to face mask oxygen  Post-op Assessment: Report given to RN and Post -op Vital signs reviewed and stable  Post vital signs: Reviewed and stable  Last Vitals:  Vitals Value Taken Time  BP    Temp    Pulse 71 02/02/24 1107  Resp    SpO2 99 % 02/02/24 1107  Vitals shown include unfiled device data.  Last Pain:  Vitals:   02/02/24 0813  TempSrc: Temporal  PainSc: 2          Complications: No notable events documented.

## 2024-02-02 NOTE — Anesthesia Postprocedure Evaluation (Signed)
 Anesthesia Post Note  Patient: Chris Vega  Procedure(s) Performed: EXCISION, MORTON'S NEUROMA (Right)     Patient location during evaluation: PACU Anesthesia Type: General Level of consciousness: awake and alert Pain management: pain level controlled Vital Signs Assessment: post-procedure vital signs reviewed and stable Respiratory status: spontaneous breathing, nonlabored ventilation and respiratory function stable Cardiovascular status: blood pressure returned to baseline and stable Postop Assessment: no apparent nausea or vomiting Anesthetic complications: no  No notable events documented.  Last Vitals:  Vitals:   02/02/24 1130 02/02/24 1145  BP: 107/70   Pulse: 70 68  Resp: 10 11  Temp:    SpO2: 95% 94%    Last Pain:  Vitals:   02/02/24 1145  TempSrc:   PainSc: 2                  Corin Tilly,W. EDMOND

## 2024-02-02 NOTE — H&P (Signed)
 H&P Update:  -History and Physical Reviewed  -Patient has been re-examined  -No change in the plan of care  -The risks and benefits were presented and reviewed. The risks due to recurrence of neuroma, suture failure and/or irritation, new/persistent infection, stiffness, nerve/vessel/tendon injury or rerupture of repaired tendon, nonunion/malunion, allograft usage, wound healing issues, development of arthritis, failure of this surgery, possibility of external fixation with delayed definitive surgery, need for further surgery, thromboembolic events, anesthesia/medical complications, amputation, death among others were discussed. The patient acknowledged the explanation, agreed to proceed with the plan and a consent was signed.  Ali Ink

## 2024-02-02 NOTE — Anesthesia Procedure Notes (Addendum)
 Procedure Name: LMA Insertion Date/Time: 02/02/2024 10:23 AM  Performed by: Eugenia Hess, CRNAPre-anesthesia Checklist: Patient identified, Emergency Drugs available, Suction available and Patient being monitored Patient Re-evaluated:Patient Re-evaluated prior to induction Oxygen Delivery Method: Circle System Utilized Preoxygenation: Pre-oxygenation with 100% oxygen Induction Type: IV induction Ventilation: Mask ventilation without difficulty LMA: LMA inserted LMA Size: 5.0 Number of attempts: 1 Airway Equipment and Method: bite block Placement Confirmation: positive ETCO2 Tube secured with: Tape Dental Injury: Teeth and Oropharynx as per pre-operative assessment

## 2024-02-03 ENCOUNTER — Encounter (HOSPITAL_BASED_OUTPATIENT_CLINIC_OR_DEPARTMENT_OTHER): Payer: Self-pay | Admitting: Orthopaedic Surgery

## 2024-03-06 ENCOUNTER — Ambulatory Visit (HOSPITAL_COMMUNITY)
Admission: EM | Admit: 2024-03-06 | Discharge: 2024-03-06 | Disposition: A | Discharge status: Home / Self Care | Attending: Internal Medicine | Admitting: Internal Medicine

## 2024-03-06 ENCOUNTER — Other Ambulatory Visit (HOSPITAL_COMMUNITY): Payer: Self-pay

## 2024-03-06 ENCOUNTER — Encounter (HOSPITAL_COMMUNITY): Payer: Self-pay

## 2024-03-06 DIAGNOSIS — L500 Allergic urticaria: Secondary | ICD-10-CM | POA: Diagnosis not present

## 2024-03-06 DIAGNOSIS — T63461A Toxic effect of venom of wasps, accidental (unintentional), initial encounter: Secondary | ICD-10-CM

## 2024-03-06 DIAGNOSIS — R220 Localized swelling, mass and lump, head: Secondary | ICD-10-CM | POA: Diagnosis not present

## 2024-03-06 MED ORDER — CETIRIZINE HCL 10 MG PO TABS
10.0000 mg | ORAL_TABLET | Freq: Every day | ORAL | 0 refills | Status: AC
  Filled 2024-03-06: qty 7, 7d supply, fill #0

## 2024-03-06 MED ORDER — EPINEPHRINE 0.3 MG/0.3ML IJ SOAJ
0.3000 mg | INTRAMUSCULAR | 0 refills | Status: AC | PRN
  Filled 2024-03-06: qty 2, 14d supply, fill #0

## 2024-03-06 MED ORDER — PREDNISONE 20 MG PO TABS
40.0000 mg | ORAL_TABLET | Freq: Every day | ORAL | 0 refills | Status: AC
Start: 2024-03-07 — End: 2024-03-12
  Filled 2024-03-06: qty 10, 5d supply, fill #0

## 2024-03-06 MED ORDER — FAMOTIDINE 20 MG PO TABS
40.0000 mg | ORAL_TABLET | Freq: Every day | ORAL | 0 refills | Status: AC
  Filled 2024-03-06: qty 14, 7d supply, fill #0

## 2024-03-06 MED ORDER — FAMOTIDINE 20 MG PO TABS
ORAL_TABLET | ORAL | Status: AC
  Filled 2024-03-06: qty 2

## 2024-03-06 MED ORDER — METHYLPREDNISOLONE SODIUM SUCC 125 MG IJ SOLR
INTRAMUSCULAR | Status: AC
  Filled 2024-03-06: qty 2

## 2024-03-06 MED ORDER — METHYLPREDNISOLONE SODIUM SUCC 125 MG IJ SOLR
125.0000 mg | Freq: Once | INTRAMUSCULAR | Status: AC
  Administered 2024-03-06: 125 mg via INTRAMUSCULAR

## 2024-03-06 MED ORDER — FAMOTIDINE 20 MG PO TABS
40.0000 mg | ORAL_TABLET | Freq: Once | ORAL | Status: AC
  Administered 2024-03-06: 40 mg via ORAL

## 2024-03-06 NOTE — Discharge Instructions (Signed)
 You were seen in urgent care today for an allergic reaction due to wasp sting.  We gave you a shot of steroid in the clinic and a dose of Pepcid .  Continue taking Pepcid  and Zyrtec  once daily for the next 7 days to further suppress allergic reaction.  Starting tomorrow, take prednisone  40 mg once daily for the next 5 days.  Take with food to avoid stomach upset.  Do not take any NSAIDs (ibuprofen/aspirin) while taking prednisone  as this can lead to stomach upset. Take prednisone  each morning before 11 AM with breakfast.  If you have another wasp sting/allergic reaction and develop full body hives, throat swelling, shortness of breath, tongue swelling, etc., I have prescribed an EpiPen  for you to use for the symptoms as this would be consistent with anaphylaxis.  If you have to use your EpiPen , you will be required to call 911 immediately for transport to the hospital for cardiac monitoring after using EpiPen .  Please schedule a follow-up appointment with [facility] for allergy testing and follow-up evaluation.  If your symptoms do not improve or if your symptoms worsen in the next 24 to 48 hours, please return to clinic. Otherwise, follow-up with PCP/allergy and asthma specialist.

## 2024-03-06 NOTE — ED Triage Notes (Signed)
 Pt c/o hives and swollen bottom lip since during night. Took zyrtec  this am with some relief. Denies throat swelling. States stung by 2 wasp yesterday.

## 2024-03-06 NOTE — ED Provider Notes (Signed)
 [facility]    CSN: [id number] Arrival date & time: 03/06/24  0804      History   Chief Complaint Chief Complaint  Patient presents with   Allergic Reaction    HPI Chris Vega is a 33 y.o. male.   Chris Vega is a 33 y.o. male presenting for chief complaint of hives and lip swelling due to presumed allergic reaction.  He was stung by a wasp to the left upper back and right arm yesterday.  Denies immediate symptoms, hives/lip swelling started approximately 5 or 6 hours after wasp sting overnight last night.  The right side of his lower lip is swollen.  He has hives to the bilateral arms, upper back, upper chest, and bilateral legs.  He was stung by wasp approximately 2 to 3 weeks ago and had localized swelling/hives without lip swelling or diffuse hives.  Denies history of anaphylactic reaction to medication, bee sting/wasp sting, or foods.  Currently denies shortness of breath, tongue swelling, throat swelling, dizziness, fever, chills, nausea, vomiting, chest tightness, and difficulty maintaining secretions. He took a 10 mg Zyrtec  this morning and believes that this has helped to prevent further progression of hives. He has never been evaluated by an allergist, though he does have a history of allergic rhinitis due to environmental allergies.   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 Authorizing Provider  cetirizine  (ZYRTEC ) 10 MG tablet Take 1 tablet (10 mg total) by mouth daily for 7 days. 03/06/24 03/13/24 Yes Enedelia Dorna HERO, FNP  EPINEPHrine  0.3 mg/0.3 mL IJ SOAJ injection Inject 0.3 mg into the muscle as needed for anaphylaxis. 03/06/24  Yes Enedelia Dorna HERO, FNP  famotidine  (PEPCID ) 20 MG tablet Take 2 tablets (40 mg total) by mouth at bedtime for 7 days. 03/06/24 03/13/24 Yes StanhopeDorna HERO, FNP  predniSONE  (DELTASONE ) 20 MG tablet Take 2 tablets (40 mg total) by mouth daily with breakfast for 5 days. 03/07/24 03/12/24 Yes Samwise Eckardt, Dorna HERO, FNP    Family History Family History  Problem Relation Age of Onset   Lung cancer Maternal Grandfather    Breast cancer Paternal Grandmother    OCD Mother     Social History Social History   Tobacco Use   Smoking status: Never   Smokeless tobacco: Never  Vaping Use   Vaping status: Former   Quit date: 06/03/2017  Substance Use Topics   Alcohol use: Yes    Comment: social drinker   Drug use: Not Currently    Comment: quit 2 years ago     Allergies   Yellow jacket venom   Review of Systems Review of Systems Per HPI  Physical Exam Triage Vital Signs ED Triage Vitals  Encounter Vitals Group     BP 03/06/24 0812 (!) 132/106  Girls Systolic BP Percentile --      Girls Diastolic BP Percentile --      Boys Systolic BP Percentile --      Boys Diastolic BP Percentile --      Pulse Rate 03/06/24 0812 75     Resp 03/06/24 0812 18     Temp 03/06/24 0812 98.2 F (36.8 C)     Temp Source 03/06/24 0812 Oral     SpO2 03/06/24 0812 96 %     Weight --      Height --      Head Circumference --      Peak Flow --      Pain Score 03/06/24 0813 5     Pain Loc --      Pain Education --      Exclude from Growth Chart --    No data found.  Updated Vital Signs BP (!) 132/106 (BP  Location: Right Arm)   Pulse 75   Temp 98.2 F (36.8 C) (Oral)   Resp 18   SpO2 96%   Visual Acuity Right Eye Distance:   Left Eye Distance:   Bilateral Distance:    Right Eye Near:   Left Eye Near:    Bilateral Near:     Physical Exam Vitals and nursing note reviewed.  Constitutional:      Appearance: He is not ill-appearing or toxic-appearing.  HENT:     Head: Normocephalic and atraumatic.     Right Ear: Hearing, tympanic membrane, ear canal and external ear normal.     Left Ear: Hearing, tympanic membrane, ear canal and external ear normal.     Nose: Nose normal.     Mouth/Throat:     Lips: Pink.     Mouth: Mucous membranes are moist. No injury or oral lesions.     Dentition: Normal dentition.     Tongue: No lesions.     Pharynx: Oropharynx is clear. Uvula midline. No pharyngeal swelling, oropharyngeal exudate, posterior oropharyngeal erythema, uvula swelling or postnasal drip.     Tonsils: No tonsillar exudate.      Comments: Smiles symmetrically, normal facial neuro exam. No trismus, phonation normal, maintaining secretions without difficulty.  Eyes:     General: Lids are normal. Vision grossly intact. Gaze aligned appropriately.     Extraocular Movements: Extraocular movements intact.     Conjunctiva/sclera: Conjunctivae normal.  Neck:     Trachea: Trachea and phonation normal.  Cardiovascular:     Rate and Rhythm: Normal rate and regular rhythm.     Heart sounds: Normal heart sounds, S1 normal and S2 normal.  Pulmonary:     Effort: Pulmonary effort is normal. No respiratory distress.     Breath sounds: Normal breath sounds and air entry. No wheezing, rhonchi or rales.     Comments: Speaking in full sentences without difficulty.  No respiratory distress noted. Chest:     Chest wall: No tenderness.  Musculoskeletal:     Cervical back: Neck supple.  Lymphadenopathy:     Cervical: No cervical adenopathy.  Skin:    General: Skin is warm and dry.     Capillary  Refill: Capillary refill takes less than 2 seconds.     Findings: Rash present. Rash is urticarial.     Comments: Urticarial rash to the left shoulder at site of wasp sting extending to the left scapula and some lesions to the left arm. Urticarial rash to the right forearm surrounding site of wasp sting. Some  urticarial lesions to the rest of the trunk and the bilateral thighs. No rash to the face.   Neurological:     General: No focal deficit present.     Mental Status: He is alert and oriented to person, place, and time. Mental status is at baseline.     Cranial Nerves: No dysarthria or facial asymmetry.  Psychiatric:        Mood and Affect: Mood normal.        Speech: Speech normal.        Behavior: Behavior normal.        Thought Content: Thought content normal.        Judgment: Judgment normal.      UC Treatments / Results  Labs (all labs ordered are listed, but only abnormal results are displayed) Labs Reviewed - No data to display  EKG   Radiology No results found.  Procedures Procedures (including critical care time)  Medications Ordered in UC Medications  famotidine  (PEPCID ) tablet 40 mg (40 mg Oral Given 03/06/24 0831)  methylPREDNISolone  sodium succinate (SOLU-MEDROL ) 125 mg/2 mL injection 125 mg (125 mg Intramuscular Given 03/06/24 0831)    Initial Impression / Assessment and Plan / UC Course  I have reviewed the triage vital signs and the nursing notes.  Pertinent labs & imaging results that were available during my care of the patient were reviewed by me and considered in my medical decision making (see chart for details).   1. Allergic urticaria, allergic reaction to wasp sting, lip swelling Allergic reaction/hypersensitivity reaction to wasp sting with diffuse hives and slightly delayed reaction.  He does not currently meet criteria to call this anaphylaxis, however this reaction to wasp sting is significantly worse than his last one causing concern for  worsening allergy to wasps.   125mg  solumedrol IM and 40mg  pepcid  given in clinic with significant improvement in lip swelling and prominence of hives 30 minutes after medications were given.  Prednisone  burst 40mg  every day for 5 days to be started tomorrow. Pepcid  20mg  and zyrtec  10mg  daily for 7 days.  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  He is educated on how to use EpiPen  and verbalizes understanding that he will call 911 if he needs to use this for anaphylaxis signs/symptoms.  Recommend follow-up with [facility] for allergy testing.   Counseled patient on potential for adverse effects with medications prescribed/recommended today, strict ER and return-to-clinic precautions discussed, patient verbalized understanding.    Final Clinical Impressions(s) / UC Diagnoses   Final diagnoses:  Allergic urticaria  Allergic reaction to wasp sting  Lip swelling     Discharge Instructions      You were seen in urgent care today for an allergic reaction due to wasp sting.  We gave you a shot of steroid in the clinic and a dose of Pepcid .  Continue taking Pepcid  and Zyrtec  once daily for the next 7 days to further suppress allergic reaction.  Starting tomorrow, take prednisone  40 mg once daily for the next 5 days.  Take with food to avoid stomach upset.  Do not take any NSAIDs (ibuprofen/aspirin) while taking prednisone  as this can lead to stomach upset. Take prednisone  each morning before 11 AM with breakfast.  If you have another wasp sting/allergic reaction and develop full  body hives, throat swelling, shortness of breath, tongue swelling, etc., I have prescribed an EpiPen  for you to use for the symptoms as this would be consistent with anaphylaxis.  If you have to use your EpiPen , you will be  required to call 911 immediately for transport to the hospital for cardiac monitoring after using EpiPen .  Please schedule a follow-up appointment with [facility] for allergy testing and follow-up evaluation.  If your symptoms do not improve or if your symptoms worsen in the next 24 to 48 hours, please return to clinic. Otherwise, follow-up with PCP/allergy and asthma specialist.      ED Prescriptions     Medication Sig Dispense Auth. Provider   EPINEPHrine  0.3 mg/0.3 mL IJ SOAJ injection Inject 0.3 mg into the muscle as needed for anaphylaxis. 2 each Enedelia Dorna HERO, FNP   famotidine  (PEPCID ) 20 MG tablet Take 2 tablets (40 mg total) by mouth at bedtime for 7 days. 14 tablet Enedelia Dorna M, FNP   cetirizine  (ZYRTEC ) 10 MG tablet Take 1 tablet (10 mg total) by mouth daily for 7 days. 7 tablet Enedelia Dorna M, FNP   predniSONE  (DELTASONE ) 20 MG tablet Take 2 tablets (40 mg total) by mouth daily with breakfast for 5 days. 10 tablet Enedelia Dorna HERO, FNP      PDMP not reviewed this encounter.   Enedelia Dorna HERO, [place] 03/07/24 1216

## 2024-03-22 ENCOUNTER — Other Ambulatory Visit (HOSPITAL_BASED_OUTPATIENT_CLINIC_OR_DEPARTMENT_OTHER): Payer: Self-pay | Admitting: Family Medicine

## 2024-03-22 DIAGNOSIS — Z8249 Family history of ischemic heart disease and other diseases of the circulatory system: Secondary | ICD-10-CM

## 2024-03-27 ENCOUNTER — Inpatient Hospital Stay (HOSPITAL_BASED_OUTPATIENT_CLINIC_OR_DEPARTMENT_OTHER): Admission: RE | Admit: 2024-03-27 | Source: Ambulatory Visit

## 2024-03-30 ENCOUNTER — Other Ambulatory Visit (HOSPITAL_BASED_OUTPATIENT_CLINIC_OR_DEPARTMENT_OTHER)

## 2024-04-01 ENCOUNTER — Ambulatory Visit (HOSPITAL_BASED_OUTPATIENT_CLINIC_OR_DEPARTMENT_OTHER)
Admission: RE | Admit: 2024-04-01 | Discharge: 2024-04-01 | Disposition: A | Discharge status: Home / Self Care | Payer: Self-pay | Source: Ambulatory Visit | Attending: Family Medicine | Admitting: Family Medicine

## 2024-04-01 DIAGNOSIS — Z8249 Family history of ischemic heart disease and other diseases of the circulatory system: Secondary | ICD-10-CM | POA: Insufficient documentation
